# Patient Record
Sex: Male | Born: 1960
Health system: Southern US, Community
[De-identification: ages and names within clinical notes are randomized; demographics above are authoritative.]

## PROBLEM LIST (undated history)

## (undated) DIAGNOSIS — T4145XA Adverse effect of unspecified anesthetic, initial encounter: Secondary | ICD-10-CM

## (undated) DIAGNOSIS — H269 Unspecified cataract: Secondary | ICD-10-CM

## (undated) DIAGNOSIS — R002 Palpitations: Secondary | ICD-10-CM

## (undated) DIAGNOSIS — T8859XA Other complications of anesthesia, initial encounter: Secondary | ICD-10-CM

## (undated) DIAGNOSIS — K219 Gastro-esophageal reflux disease without esophagitis: Secondary | ICD-10-CM

## (undated) DIAGNOSIS — G4730 Sleep apnea, unspecified: Secondary | ICD-10-CM

## (undated) DIAGNOSIS — T7840XA Allergy, unspecified, initial encounter: Secondary | ICD-10-CM

## (undated) DIAGNOSIS — K4090 Unilateral inguinal hernia, without obstruction or gangrene, not specified as recurrent: Secondary | ICD-10-CM

## (undated) HISTORY — DX: Sleep apnea, unspecified: G47.30

## (undated) HISTORY — PX: COLONOSCOPY: SHX174

## (undated) HISTORY — DX: Gastro-esophageal reflux disease without esophagitis: K21.9

## (undated) HISTORY — DX: Palpitations: R00.2

## (undated) HISTORY — PX: CARDIAC ELECTROPHYSIOLOGY MAPPING AND ABLATION: SHX1292

## (undated) HISTORY — PX: POLYPECTOMY: SHX149

## (undated) HISTORY — DX: Unspecified cataract: H26.9

## (undated) HISTORY — DX: Allergy, unspecified, initial encounter: T78.40XA

---

## 2004-03-27 ENCOUNTER — Ambulatory Visit (HOSPITAL_COMMUNITY): Admission: RE | Admit: 2004-03-27 | Discharge: 2004-03-28 | Payer: Self-pay | Admitting: Ophthalmology

## 2004-10-13 HISTORY — PX: RETINAL DETACHMENT REPAIR W/ SCLERAL BUCKLE LE: SHX2338

## 2006-08-21 ENCOUNTER — Encounter: Admission: RE | Admit: 2006-08-21 | Discharge: 2006-08-21 | Payer: Self-pay | Admitting: Cardiology

## 2009-10-13 HISTORY — PX: VASECTOMY: SHX75

## 2012-03-18 ENCOUNTER — Encounter (INDEPENDENT_AMBULATORY_CARE_PROVIDER_SITE_OTHER): Payer: 59 | Admitting: Ophthalmology

## 2012-03-18 DIAGNOSIS — H33009 Unspecified retinal detachment with retinal break, unspecified eye: Secondary | ICD-10-CM

## 2012-03-18 DIAGNOSIS — H43819 Vitreous degeneration, unspecified eye: Secondary | ICD-10-CM

## 2012-03-18 DIAGNOSIS — H35379 Puckering of macula, unspecified eye: Secondary | ICD-10-CM

## 2012-03-18 DIAGNOSIS — H2510 Age-related nuclear cataract, unspecified eye: Secondary | ICD-10-CM

## 2012-03-18 DIAGNOSIS — H33309 Unspecified retinal break, unspecified eye: Secondary | ICD-10-CM

## 2012-03-18 DIAGNOSIS — H3530 Unspecified macular degeneration: Secondary | ICD-10-CM

## 2012-08-09 ENCOUNTER — Other Ambulatory Visit: Payer: Self-pay | Admitting: Family Medicine

## 2012-08-09 DIAGNOSIS — M542 Cervicalgia: Secondary | ICD-10-CM

## 2012-08-13 ENCOUNTER — Ambulatory Visit
Admission: RE | Admit: 2012-08-13 | Discharge: 2012-08-13 | Disposition: A | Discharge status: Home / Self Care | Payer: BC Managed Care – PPO | Source: Ambulatory Visit | Attending: Family Medicine | Admitting: Family Medicine

## 2012-08-13 DIAGNOSIS — M542 Cervicalgia: Secondary | ICD-10-CM

## 2013-02-23 ENCOUNTER — Encounter: Payer: Self-pay | Admitting: Internal Medicine

## 2013-02-23 ENCOUNTER — Ambulatory Visit (INDEPENDENT_AMBULATORY_CARE_PROVIDER_SITE_OTHER): Payer: BC Managed Care – PPO | Admitting: Internal Medicine

## 2013-02-23 VITALS — BP 107/70 | HR 61 | Ht 72.0 in | Wt 165.0 lb

## 2013-02-23 DIAGNOSIS — R002 Palpitations: Secondary | ICD-10-CM

## 2013-02-23 HISTORY — DX: Palpitations: R00.2

## 2013-02-23 MED ORDER — VERAPAMIL HCL 40 MG PO TABS: ORAL_TABLET | ORAL | Status: DC

## 2013-02-23 NOTE — Patient Instructions (Addendum)
Your physician recommends that you schedule a follow-up appointment as needed   Your physician has recommended you make the following change in your medication:  1) Will call in Verapamil 40mg  as needed #10 no refills

## 2013-02-23 NOTE — Assessment & Plan Note (Signed)
The patient has tachypalpitations. These are new in onset over the last 3 months. There are abrupt in onset and offset and associated with lightheadedness. They're frog positive which strongly suggests AV node reentry. At this point he would like to do nothing. We reviewed Valsalva and carotid massage; I've also given a prescription for verapamil 40 to have available on an as-needed basis. To followup as needed

## 2013-02-23 NOTE — Progress Notes (Signed)
   ELECTROPHYSIOLOGY CONSULT NOTE  Patient ID: Blake Sims, MRN: [id number], DOB/AGE: May 09, 1961 52 y.o. Admit date: (Not on file) Date of Consult: 02/23/2013  Primary Physician: No primary provider on file. Primary Cardiologist: no  Chief Complaint:    HPI Blake Sims is a 52 y.o. male    Is seen at his own request because of the recent onset of tachypalpitations. These have lasted 30-60 seconds. It are abrupt in onset and offset. They are frog positive. They're associated with lightheadedness and shortness of breath.  Otherwise he is exceptionally fit without limitations.     No past medical history on file.    Surgical History: No past surgical history on file.   Home Meds: Prior to Admission medications   Not on File     Allergies:  Allergies  Allergen Reactions  . Penicillins     Swelling and itching    History   Social History  . Marital Status: Single    Spouse Name: N/A    Number of Children: N/A  . Years of Education: N/A   Occupational History  . Not on file.   Social History Main Topics  . Smoking status: Never Smoker   . Smokeless tobacco: Not on file  . Alcohol Use: Not on file  . Drug Use: Not on file  . Sexually Active: Not on file   Other Topics Concern  . Not on file   Social History Narrative  . No narrative on file     No family history on file.   ROS:  Please see the history of present illness.     All other systems reviewed and negative.    Physical Exam: Blood pressure 107/70, pulse 61, height 6' (1.829 m), weight 165 lb (74.844 kg). General: Well developed, well nourished male in no acute distress. Head: Normocephalic, atraumatic, sclera non-icteric, no xanthomas, nares are without discharge. EENT: normal Lymph Nodes:  none Back: without scoliosis/kyphosis, no CVA tendersness Neck: Negative for carotid bruits. JVD not elevated. Lungs: Clear bilaterally to auscultation without wheezes, rales, or rhonchi.  Breathing is unlabored. Heart: RRR with S1 S2. No   systolic murmur , rubs, or gallops appreciated. Abdomen: Soft, non-tender, non-distended with normoactive bowel sounds. No hepatomegaly. No rebound/guarding. No obvious abdominal masses. Msk:  Strength and tone appear normal for age. Extremities: No clubbing or cyanosis. No ed ema.  Distal pedal pulses are 2+ and equal bilaterally. Skin: Warm and Dry Neuro: Alert and oriented X 3. CN III-XII intact Grossly normal sensory and motor function . Psych:  Responds to questions appropriately with a normal affect.      Labs: Cardiac Enzymes No results found for this basename: CKTOTAL, CKMB, TROPONINI,  in the last 72 hours CBC  Radiology/Studies:  No results found.  EKG: NSR 14/09/42 nst    Assessment and Plan:    Blake Sims

## 2013-03-18 ENCOUNTER — Ambulatory Visit (INDEPENDENT_AMBULATORY_CARE_PROVIDER_SITE_OTHER): Payer: BC Managed Care – PPO | Admitting: Ophthalmology

## 2013-03-18 DIAGNOSIS — H35349 Macular cyst, hole, or pseudohole, unspecified eye: Secondary | ICD-10-CM

## 2013-03-18 DIAGNOSIS — H43819 Vitreous degeneration, unspecified eye: Secondary | ICD-10-CM

## 2013-03-18 DIAGNOSIS — H2510 Age-related nuclear cataract, unspecified eye: Secondary | ICD-10-CM

## 2013-03-18 DIAGNOSIS — H33009 Unspecified retinal detachment with retinal break, unspecified eye: Secondary | ICD-10-CM

## 2013-03-18 DIAGNOSIS — H4420 Degenerative myopia, unspecified eye: Secondary | ICD-10-CM

## 2013-03-18 DIAGNOSIS — H33309 Unspecified retinal break, unspecified eye: Secondary | ICD-10-CM

## 2014-03-22 ENCOUNTER — Ambulatory Visit (INDEPENDENT_AMBULATORY_CARE_PROVIDER_SITE_OTHER): Payer: BC Managed Care – PPO | Admitting: Ophthalmology

## 2014-03-22 DIAGNOSIS — H2510 Age-related nuclear cataract, unspecified eye: Secondary | ICD-10-CM

## 2014-03-22 DIAGNOSIS — H33309 Unspecified retinal break, unspecified eye: Secondary | ICD-10-CM

## 2014-03-22 DIAGNOSIS — H33009 Unspecified retinal detachment with retinal break, unspecified eye: Secondary | ICD-10-CM

## 2014-03-22 DIAGNOSIS — H43819 Vitreous degeneration, unspecified eye: Secondary | ICD-10-CM

## 2014-03-22 DIAGNOSIS — H4420 Degenerative myopia, unspecified eye: Secondary | ICD-10-CM

## 2014-04-12 ENCOUNTER — Encounter (INDEPENDENT_AMBULATORY_CARE_PROVIDER_SITE_OTHER): Payer: Self-pay | Admitting: Surgery

## 2014-04-25 ENCOUNTER — Encounter (INDEPENDENT_AMBULATORY_CARE_PROVIDER_SITE_OTHER): Payer: Self-pay | Admitting: Surgery

## 2014-04-25 ENCOUNTER — Ambulatory Visit (INDEPENDENT_AMBULATORY_CARE_PROVIDER_SITE_OTHER): Payer: BC Managed Care – PPO | Admitting: Surgery

## 2014-04-25 VITALS — BP 118/60 | HR 64 | Temp 98.6°F | Resp 14 | Ht 72.0 in | Wt 169.6 lb

## 2014-04-25 DIAGNOSIS — K4020 Bilateral inguinal hernia, without obstruction or gangrene, not specified as recurrent: Secondary | ICD-10-CM | POA: Insufficient documentation

## 2014-04-25 HISTORY — DX: Bilateral inguinal hernia, without obstruction or gangrene, not specified as recurrent: K40.20

## 2014-04-25 NOTE — Progress Notes (Signed)
Patient ID: Blake Sims, male   DOB: 06-Jul-1961, 53 y.o.   MRN: [id number]  Chief Complaint  Patient presents with  . Inguinal Hernia    new pt- eval [facility]    HPI Blake Sims is a 53 y.o. male.  Referred by Dr. Orpah Melter for evaluation of left inguinal hernia  HPI This is a healthy 53 year old male who presents with several years of intermittent discomfort in his left groin. He was previously evaluated by a surgeon and 2009 and was not felt to have a hernia at that time. The patient is quite healthy and exercises vigorously. He has developed discomfort in both groins. The left side seems to be worse than the right. Occasionally feels pulling sensations on each side. He denies any visible or palpable bulge.  He denies any obstructive symptoms. Past Medical History  Diagnosis Date  . Rapid palpitations 02/23/2013    Past Surgical History  Procedure Laterality Date  . Retinal detachment repair w/ scleral buckle le      History reviewed. No pertinent family history.  Social History History  Substance Use Topics  . Smoking status: Never Smoker   . Smokeless tobacco: Not on file  . Alcohol Use: No    Allergies  Allergen Reactions  . Penicillins     Swelling and itching    No current outpatient prescriptions on file.   No current facility-administered medications for this visit.    Review of Systems Review of Systems  Constitutional: Negative for fever, chills and unexpected weight change.  HENT: Negative for congestion, hearing loss, sore throat, trouble swallowing and voice change.   Eyes: Negative for visual disturbance.  Respiratory: Negative for cough and wheezing.   Cardiovascular: Negative for chest pain, palpitations and leg swelling.  Gastrointestinal: Negative for nausea, vomiting, abdominal pain, diarrhea, constipation, blood in stool, abdominal distention, anal bleeding and rectal pain.  Genitourinary: Negative for hematuria and difficulty  urinating.  Musculoskeletal: Negative for arthralgias.  Skin: Negative for rash and wound.  Neurological: Negative for seizures, syncope, weakness and headaches.  Hematological: Negative for adenopathy. Does not bruise/bleed easily.  Psychiatric/Behavioral: Negative for confusion.    Blood pressure 118/60, pulse 64, temperature 98.6 F (37 C), temperature source Oral, resp. rate 14, height 6' (1.829 m), weight 169 lb 9.6 oz (76.93 kg).  Physical Exam Physical Exam WDWN in NAD HEENT:  EOMI, sclera anicteric Neck:  No masses, no thyromegaly Lungs:  CTA bilaterally; normal respiratory effort CV:  Regular rate and rhythm; no murmurs Abd:  +bowel sounds, soft, non-tender, no masses GU: Bilateral descended testes, no testicular masses. Small spontaneously reducible left inguinal hernia when standing with Valsalva maneuver. The patient has some laxity and a very small bulge in the right inguinal canal on Valsalva maneuver. Ext:  Well-perfused; no edema Skin:  Warm, dry; no sign of jaundice  Data Reviewed none  Assessment    Bilateral inguinal hernias - both spontaneously reducible     Plan    Laparoscopic bilateral inguinal hernia repairs with mesh.  The surgical procedure has been discussed with the patient.  Potential risks, benefits, alternative treatments, and expected outcomes have been explained.  All of the patient's questions at this time have been answered.  The likelihood of reaching the patient's treatment goal is good.  The patient understand the proposed surgical procedure and wishes to proceed.         Orean Giarratano K. 04/25/2014, 6:18 PM

## 2014-05-31 ENCOUNTER — Encounter (HOSPITAL_COMMUNITY): Payer: Self-pay

## 2014-05-31 ENCOUNTER — Other Ambulatory Visit (INDEPENDENT_AMBULATORY_CARE_PROVIDER_SITE_OTHER): Payer: Self-pay | Admitting: Surgery

## 2014-05-31 ENCOUNTER — Encounter (HOSPITAL_COMMUNITY)
Admission: RE | Admit: 2014-05-31 | Discharge: 2014-05-31 | Disposition: A | Discharge status: Home / Self Care | Payer: BC Managed Care – PPO | Source: Ambulatory Visit | Attending: Surgery | Admitting: Surgery

## 2014-05-31 DIAGNOSIS — Z01818 Encounter for other preprocedural examination: Secondary | ICD-10-CM | POA: Insufficient documentation

## 2014-05-31 DIAGNOSIS — K4090 Unilateral inguinal hernia, without obstruction or gangrene, not specified as recurrent: Secondary | ICD-10-CM | POA: Insufficient documentation

## 2014-05-31 HISTORY — DX: Other complications of anesthesia, initial encounter: T88.59XA

## 2014-05-31 HISTORY — DX: Adverse effect of unspecified anesthetic, initial encounter: T41.45XA

## 2014-05-31 HISTORY — DX: Unilateral inguinal hernia, without obstruction or gangrene, not specified as recurrent: K40.90

## 2014-05-31 LAB — CBC
HEMATOCRIT: 44.2 % (ref 39.0–52.0)
HEMOGLOBIN: 15.4 g/dL (ref 13.0–17.0)
MCH: 32.2 pg (ref 26.0–34.0)
MCHC: 34.8 g/dL (ref 30.0–36.0)
MCV: 92.3 fL (ref 78.0–100.0)
Platelets: 176 10*3/uL (ref 150–400)
RBC: 4.79 MIL/uL (ref 4.22–5.81)
RDW: 12.2 % (ref 11.5–15.5)
WBC: 5.5 10*3/uL (ref 4.0–10.5)

## 2014-05-31 NOTE — Pre-Procedure Instructions (Addendum)
Theodor Mustin Tangeman  05/31/2014   Your procedure is scheduled on:  Thursday, June 08, 2014  Report to [facility] Admitting at 5:30 AM.  Call this number if you have problems the morning of surgery: [phone]   Remember:   Do not eat food or drink liquids after midnight Wednesday, June 07, 2014   Take these medicines the morning of surgery with A SIP OF WATER: none  Stop taking any Aspirin, multivitamins, and herbal medications. Do not take any NSAIDs ie: Ibuprofen, Advil, Naproxen or any medication containing Aspirin; stop Thursday,  06/03/14.    Do not wear jewelry, make-up or nail polish.  Do not wear lotions, powders, or perfumes. You may not wear deodorant.  Do not shave 48 hours prior to surgery. Men may shave face and neck.  Do not bring valuables to the hospital.  [facility] is not responsible for any belongings or valuables.               Contacts, dentures or bridgework may not be worn into surgery.  Leave suitcase in the car. After surgery it may be brought to your room.  For patients admitted to the hospital, discharge time is determined by your treatment team.               Patients discharged the day of surgery will not be allowed to drive home.  Name and phone number of your driver:  Special Instructions:  Special Instructions:Special Instructions: [facility] - Preparing for Surgery  Before surgery, you can play an important role.  Because skin is not sterile, your skin needs to be as free of germs as possible.  You can reduce the number of germs on you skin by washing with CHG (chlorahexidine gluconate) soap before surgery.  CHG is an antiseptic cleaner which kills germs and bonds with the skin to continue killing germs even after washing.  Please DO NOT use if you have an allergy to CHG or antibacterial soaps.  If your skin becomes reddened/irritated stop using the CHG and inform your nurse when you arrive at Short Stay.  Do not shave (including  legs and underarms) for at least 48 hours prior to the first CHG shower.  You may shave your face.  Please follow these instructions carefully:   1.  Shower with CHG Soap the night before surgery and the morning of Surgery.  2.  If you choose to wash your hair, wash your hair first as usual with your normal shampoo.  3.  After you shampoo, rinse your hair and body thoroughly to remove the Shampoo.  4.  Use CHG as you would any other liquid soap.  You can apply chg directly  to the skin and wash gently with scrungie or a clean washcloth.  5.  Apply the CHG Soap to your body ONLY FROM THE NECK DOWN.  Do not use on open wounds or open sores.  Avoid contact with your eyes, ears, mouth and genitals (private parts).  Wash genitals (private parts) with your normal soap.  6.  Wash thoroughly, paying special attention to the area where your surgery will be performed.  7.  Thoroughly rinse your body with warm water from the neck down.  8.  DO NOT shower/wash with your normal soap after using and rinsing off the CHG Soap.  9.  Pat yourself dry with a clean towel.            10.  Wear clean pajamas.  11.  Place clean sheets on your bed the night of your first shower and do not sleep with pets.  Day of Surgery  Do not apply any lotions/deodorants the morning of surgery.  Please wear clean clothes to the hospital/surgery center.   Please read over the following fact sheets that you were given: Pain Booklet, Coughing and Deep Breathing and Surgical Site Infection Prevention

## 2014-06-07 MED ORDER — VANCOMYCIN HCL IN DEXTROSE 1-5 GM/200ML-% IV SOLN
1000.0000 mg | INTRAVENOUS | Status: AC
  Administered 2014-06-08: 1000 mg via INTRAVENOUS
  Filled 2014-06-07: qty 200

## 2014-06-08 ENCOUNTER — Encounter (HOSPITAL_COMMUNITY)
Admission: RE | Disposition: A | Discharge status: Home / Self Care | Payer: Self-pay | Source: Ambulatory Visit | Attending: Surgery

## 2014-06-08 ENCOUNTER — Ambulatory Visit (HOSPITAL_COMMUNITY)
Admission: RE | Admit: 2014-06-08 | Discharge: 2014-06-08 | Disposition: A | Discharge status: Home / Self Care | Payer: BC Managed Care – PPO | Source: Ambulatory Visit | Attending: Surgery | Admitting: Surgery

## 2014-06-08 ENCOUNTER — Ambulatory Visit (HOSPITAL_COMMUNITY): Payer: BC Managed Care – PPO | Admitting: Anesthesiology

## 2014-06-08 ENCOUNTER — Encounter (HOSPITAL_COMMUNITY): Payer: BC Managed Care – PPO | Admitting: Anesthesiology

## 2014-06-08 ENCOUNTER — Encounter (HOSPITAL_COMMUNITY): Payer: Self-pay | Admitting: *Deleted

## 2014-06-08 DIAGNOSIS — K4020 Bilateral inguinal hernia, without obstruction or gangrene, not specified as recurrent: Secondary | ICD-10-CM | POA: Insufficient documentation

## 2014-06-08 DIAGNOSIS — I498 Other specified cardiac arrhythmias: Secondary | ICD-10-CM | POA: Diagnosis not present

## 2014-06-08 HISTORY — PX: INSERTION OF MESH: SHX5868

## 2014-06-08 HISTORY — PX: INGUINAL HERNIA REPAIR: SHX194

## 2014-06-08 SURGERY — REPAIR, HERNIA, INGUINAL, LAPAROSCOPIC
Anesthesia: General | Site: Groin | Laterality: Bilateral

## 2014-06-08 MED ORDER — MORPHINE SULFATE 2 MG/ML IJ SOLN: 2.0000 mg | INTRAMUSCULAR | Status: DC | PRN

## 2014-06-08 MED ORDER — OXYCODONE-ACETAMINOPHEN 5-325 MG PO TABS: 1.0000 | ORAL_TABLET | ORAL | Status: DC | PRN

## 2014-06-08 MED ORDER — BUPIVACAINE-EPINEPHRINE (PF) 0.5% -1:200000 IJ SOLN
INTRAMUSCULAR | Status: DC | PRN
  Administered 2014-06-08: 30 mL

## 2014-06-08 MED ORDER — BUPIVACAINE-EPINEPHRINE (PF) 0.25% -1:200000 IJ SOLN
INTRAMUSCULAR | Status: AC
  Filled 2014-06-08: qty 30

## 2014-06-08 MED ORDER — LIDOCAINE-EPINEPHRINE (PF) 1.5 %-1:200000 IJ SOLN
INTRAMUSCULAR | Status: DC | PRN
  Administered 2014-06-08: 30 mL

## 2014-06-08 MED ORDER — LACTATED RINGERS IV SOLN
INTRAVENOUS | Status: DC | PRN
  Administered 2014-06-08 (×2): via INTRAVENOUS

## 2014-06-08 MED ORDER — ONDANSETRON HCL 4 MG/2ML IJ SOLN: 4.0000 mg | INTRAMUSCULAR | Status: DC | PRN

## 2014-06-08 MED ORDER — BUPIVACAINE-EPINEPHRINE 0.25% -1:200000 IJ SOLN
INTRAMUSCULAR | Status: DC | PRN
  Administered 2014-06-08: 9 mL

## 2014-06-08 MED ORDER — LIDOCAINE HCL (CARDIAC) 20 MG/ML IV SOLN
INTRAVENOUS | Status: DC | PRN
  Administered 2014-06-08: 100 mg via INTRAVENOUS

## 2014-06-08 MED ORDER — MEPERIDINE HCL 25 MG/ML IJ SOLN: 6.2500 mg | INTRAMUSCULAR | Status: DC | PRN

## 2014-06-08 MED ORDER — ONDANSETRON HCL 4 MG/2ML IJ SOLN
INTRAMUSCULAR | Status: AC
  Filled 2014-06-08: qty 2

## 2014-06-08 MED ORDER — OXYCODONE HCL 5 MG/5ML PO SOLN: 5.0000 mg | Freq: Once | ORAL | Status: DC | PRN

## 2014-06-08 MED ORDER — HYDROMORPHONE HCL PF 1 MG/ML IJ SOLN
INTRAMUSCULAR | Status: AC
Start: 2014-06-08 — End: 2014-06-08
  Administered 2014-06-08: 0.5 mg via INTRAVENOUS
  Filled 2014-06-08: qty 1

## 2014-06-08 MED ORDER — MIDAZOLAM HCL 2 MG/2ML IJ SOLN
INTRAMUSCULAR | Status: AC
  Filled 2014-06-08: qty 2

## 2014-06-08 MED ORDER — DEXAMETHASONE SODIUM PHOSPHATE 10 MG/ML IJ SOLN
INTRAMUSCULAR | Status: DC | PRN
  Administered 2014-06-08: 10 mg via INTRAVENOUS

## 2014-06-08 MED ORDER — GLYCOPYRROLATE 0.2 MG/ML IJ SOLN
INTRAMUSCULAR | Status: AC
  Filled 2014-06-08: qty 2

## 2014-06-08 MED ORDER — 0.9 % SODIUM CHLORIDE (POUR BTL) OPTIME
TOPICAL | Status: DC | PRN
  Administered 2014-06-08: 1000 mL

## 2014-06-08 MED ORDER — PROPOFOL 10 MG/ML IV BOLUS
INTRAVENOUS | Status: DC | PRN
  Administered 2014-06-08: 200 mg via INTRAVENOUS

## 2014-06-08 MED ORDER — GLYCOPYRROLATE 0.2 MG/ML IJ SOLN
INTRAMUSCULAR | Status: DC | PRN
  Administered 2014-06-08: 0.2 mg via INTRAVENOUS
  Administered 2014-06-08: .7 mg via INTRAVENOUS

## 2014-06-08 MED ORDER — ROCURONIUM BROMIDE 100 MG/10ML IV SOLN
INTRAVENOUS | Status: DC | PRN
  Administered 2014-06-08: 50 mg via INTRAVENOUS

## 2014-06-08 MED ORDER — TAMSULOSIN HCL 0.4 MG PO CAPS: 0.4000 mg | ORAL_CAPSULE | Freq: Every day | ORAL | Status: DC

## 2014-06-08 MED ORDER — ARTIFICIAL TEARS OP OINT
TOPICAL_OINTMENT | OPHTHALMIC | Status: AC
  Filled 2014-06-08: qty 3.5

## 2014-06-08 MED ORDER — OXYCODONE HCL 5 MG PO TABS: 5.0000 mg | ORAL_TABLET | Freq: Once | ORAL | Status: DC | PRN

## 2014-06-08 MED ORDER — PROPOFOL 10 MG/ML IV BOLUS
INTRAVENOUS | Status: AC
  Filled 2014-06-08: qty 20

## 2014-06-08 MED ORDER — NEOSTIGMINE METHYLSULFATE 10 MG/10ML IV SOLN
INTRAVENOUS | Status: AC
  Filled 2014-06-08: qty 1

## 2014-06-08 MED ORDER — FENTANYL CITRATE 0.05 MG/ML IJ SOLN
INTRAMUSCULAR | Status: AC
  Filled 2014-06-08: qty 5

## 2014-06-08 MED ORDER — PHENYLEPHRINE 40 MCG/ML (10ML) SYRINGE FOR IV PUSH (FOR BLOOD PRESSURE SUPPORT)
PREFILLED_SYRINGE | INTRAVENOUS | Status: AC
  Filled 2014-06-08: qty 10

## 2014-06-08 MED ORDER — PHENYLEPHRINE HCL 10 MG/ML IJ SOLN
INTRAMUSCULAR | Status: DC | PRN
  Administered 2014-06-08 (×5): 80 ug via INTRAVENOUS
  Administered 2014-06-08: 40 ug via INTRAVENOUS

## 2014-06-08 MED ORDER — CHLORHEXIDINE GLUCONATE 4 % EX LIQD
1.0000 "application " | Freq: Once | CUTANEOUS | Status: DC
  Filled 2014-06-08: qty 15

## 2014-06-08 MED ORDER — NEOSTIGMINE METHYLSULFATE 10 MG/10ML IV SOLN
INTRAVENOUS | Status: DC | PRN
  Administered 2014-06-08: 4 mg via INTRAVENOUS

## 2014-06-08 MED ORDER — MIDAZOLAM HCL 5 MG/5ML IJ SOLN
INTRAMUSCULAR | Status: DC | PRN
  Administered 2014-06-08: 2 mg via INTRAVENOUS

## 2014-06-08 MED ORDER — TAMSULOSIN HCL 0.4 MG PO CAPS
0.4000 mg | ORAL_CAPSULE | Freq: Once | ORAL | Status: AC
  Administered 2014-06-08: 0.4 mg via ORAL
  Filled 2014-06-08: qty 1

## 2014-06-08 MED ORDER — SUCCINYLCHOLINE CHLORIDE 20 MG/ML IJ SOLN
INTRAMUSCULAR | Status: AC
  Filled 2014-06-08: qty 1

## 2014-06-08 MED ORDER — ONDANSETRON HCL 4 MG/2ML IJ SOLN
INTRAMUSCULAR | Status: DC | PRN
  Administered 2014-06-08: 4 mg via INTRAVENOUS

## 2014-06-08 MED ORDER — HYDROMORPHONE HCL PF 1 MG/ML IJ SOLN
0.2500 mg | INTRAMUSCULAR | Status: DC | PRN
  Administered 2014-06-08 (×2): 0.5 mg via INTRAVENOUS

## 2014-06-08 MED ORDER — ONDANSETRON HCL 4 MG/2ML IJ SOLN: 4.0000 mg | Freq: Once | INTRAMUSCULAR | Status: DC | PRN

## 2014-06-08 MED ORDER — EPHEDRINE SULFATE 50 MG/ML IJ SOLN
INTRAMUSCULAR | Status: AC
  Filled 2014-06-08: qty 1

## 2014-06-08 MED ORDER — FENTANYL CITRATE 0.05 MG/ML IJ SOLN
INTRAMUSCULAR | Status: DC | PRN
  Administered 2014-06-08: 50 ug via INTRAVENOUS
  Administered 2014-06-08: 100 ug via INTRAVENOUS

## 2014-06-08 MED ORDER — OXYCODONE-ACETAMINOPHEN 5-325 MG PO TABS
ORAL_TABLET | ORAL | Status: AC
  Administered 2014-06-08: 1 via ORAL
  Filled 2014-06-08: qty 1

## 2014-06-08 MED ORDER — ROCURONIUM BROMIDE 50 MG/5ML IV SOLN
INTRAVENOUS | Status: AC
  Filled 2014-06-08: qty 1

## 2014-06-08 MED ORDER — OXYCODONE-ACETAMINOPHEN 5-325 MG PO TABS
1.0000 | ORAL_TABLET | ORAL | Status: DC | PRN
  Administered 2014-06-08: 1 via ORAL

## 2014-06-08 SURGICAL SUPPLY — 45 items
APL SKNCLS STERI-STRIP NONHPOA (GAUZE/BANDAGES/DRESSINGS) ×1
APPLIER CLIP LOGIC TI 5 (MISCELLANEOUS) IMPLANT
APR CLP MED LRG 33X5 (MISCELLANEOUS)
BENZOIN TINCTURE PRP APPL 2/3 (GAUZE/BANDAGES/DRESSINGS) ×2 IMPLANT
CANISTER SUCTION 2500CC (MISCELLANEOUS) IMPLANT
COVER SURGICAL LIGHT HANDLE (MISCELLANEOUS) ×2 IMPLANT
DECANTER SPIKE VIAL GLASS SM (MISCELLANEOUS) ×1 IMPLANT
DEVICE SECURE STRAP 25 ABSORB (INSTRUMENTS) ×2 IMPLANT
DISSECT BALLN SPACEMKR + OVL (BALLOONS) ×2
DISSECTOR BALLN SPACEMKR + OVL (BALLOONS) ×1 IMPLANT
DISSECTOR BLUNT TIP ENDO 5MM (MISCELLANEOUS) IMPLANT
DRAPE UTILITY 15X26 W/TAPE STR (DRAPE) ×4 IMPLANT
DRSG TEGADERM 2-3/8X2-3/4 SM (GAUZE/BANDAGES/DRESSINGS) ×2 IMPLANT
DRSG TEGADERM 4X4.75 (GAUZE/BANDAGES/DRESSINGS) ×1 IMPLANT
ELECT REM PT RETURN 9FT ADLT (ELECTROSURGICAL) ×2
ELECTRODE REM PT RTRN 9FT ADLT (ELECTROSURGICAL) ×1 IMPLANT
GAUZE SPONGE 2X2 8PLY STRL LF (GAUZE/BANDAGES/DRESSINGS) IMPLANT
GLOVE BIO SURGEON STRL SZ 6.5 (GLOVE) ×1 IMPLANT
GLOVE BIO SURGEON STRL SZ7 (GLOVE) ×3 IMPLANT
GLOVE BIOGEL PI IND STRL 6 (GLOVE) IMPLANT
GLOVE BIOGEL PI IND STRL 6.5 (GLOVE) IMPLANT
GLOVE BIOGEL PI IND STRL 7.0 (GLOVE) IMPLANT
GLOVE BIOGEL PI IND STRL 7.5 (GLOVE) ×1 IMPLANT
GLOVE BIOGEL PI INDICATOR 6 (GLOVE) ×1
GLOVE BIOGEL PI INDICATOR 6.5 (GLOVE) ×2
GLOVE BIOGEL PI INDICATOR 7.0 (GLOVE) ×2
GLOVE BIOGEL PI INDICATOR 7.5 (GLOVE) ×1
GOWN STRL REUS W/ TWL LRG LVL3 (GOWN DISPOSABLE) ×2 IMPLANT
GOWN STRL REUS W/TWL LRG LVL3 (GOWN DISPOSABLE) ×8
KIT BASIN OR (CUSTOM PROCEDURE TRAY) ×2 IMPLANT
KIT ROOM TURNOVER OR (KITS) ×2 IMPLANT
MESH 3DMAX LIGHT 4.1X6.2 LT LR (Mesh General) ×1 IMPLANT
MESH 3DMAX LIGHT 4.1X6.2 RT LR (Mesh General) ×1 IMPLANT
NS IRRIG 1000ML POUR BTL (IV SOLUTION) ×2 IMPLANT
PAD ARMBOARD 7.5X6 YLW CONV (MISCELLANEOUS) ×4 IMPLANT
SET IRRIG TUBING LAPAROSCOPIC (IRRIGATION / IRRIGATOR) IMPLANT
SET TROCAR LAP APPLE-HUNT 5MM (ENDOMECHANICALS) ×2 IMPLANT
SPONGE GAUZE 2X2 STER 10/PKG (GAUZE/BANDAGES/DRESSINGS) ×1
STRIP CLOSURE SKIN 1/2X4 (GAUZE/BANDAGES/DRESSINGS) ×1 IMPLANT
SUT MNCRL AB 4-0 PS2 18 (SUTURE) ×3 IMPLANT
TOWEL OR 17X24 6PK STRL BLUE (TOWEL DISPOSABLE) ×1 IMPLANT
TOWEL OR 17X26 10 PK STRL BLUE (TOWEL DISPOSABLE) ×2 IMPLANT
TRAY FOLEY CATH 16FR SILVER (SET/KITS/TRAYS/PACK) ×2 IMPLANT
TRAY LAPAROSCOPIC (CUSTOM PROCEDURE TRAY) ×2 IMPLANT
WATER STERILE IRR 1000ML POUR (IV SOLUTION) IMPLANT

## 2014-06-08 NOTE — Transfer of Care (Signed)
Immediate Anesthesia Transfer of Care Note  Patient: Blake Sims  Procedure(s) Performed: Procedure(s): LAPAROSCOPIC BILATERAL INGUINAL HERNIA REPAIR  (Bilateral) INSERTION OF MESH (Bilateral)  Patient Location: PACU  Anesthesia Type:General  Level of Consciousness: awake, alert  and oriented  Airway & Oxygen Therapy: Patient Spontanous Breathing and Patient connected to face mask oxygen  Post-op Assessment: Report given to PACU RN  Post vital signs: Reviewed and stable  Complications: No apparent anesthesia complications

## 2014-06-08 NOTE — Anesthesia Preprocedure Evaluation (Addendum)
Anesthesia Evaluation  Patient identified by MRN, date of birth, ID band Patient awake    Reviewed: Allergy & Precautions, H&P , NPO status , Patient's Chart, lab work & pertinent test results  Airway Mallampati: II TM Distance: >3 FB Neck ROM: Full  Mouth opening: Limited Mouth Opening  Dental  (+) Teeth Intact   Pulmonary    Pulmonary exam normal       Cardiovascular + dysrhythmias Rate:Bradycardia     Neuro/Psych    GI/Hepatic   Endo/Other    Renal/GU      Musculoskeletal   Abdominal Normal abdominal exam  (+)   Peds  Hematology   Anesthesia Other Findings   Reproductive/Obstetrics                          Anesthesia Physical Anesthesia Plan  ASA: II  Anesthesia Plan: General   Post-op Pain Management:    Induction: Intravenous  Airway Management Planned: Oral ETT  Additional Equipment:   Intra-op Plan:   Post-operative Plan: Extubation in OR  Informed Consent: I have reviewed the patients History and Physical, chart, labs and discussed the procedure including the risks, benefits and alternatives for the proposed anesthesia with the patient or authorized representative who has indicated his/her understanding and acceptance.   Dental advisory given  Plan Discussed with: CRNA and Surgeon  Anesthesia Plan Comments:        Anesthesia Quick Evaluation

## 2014-06-08 NOTE — Anesthesia Procedure Notes (Addendum)
Anesthesia Regional Block:  TAP block  Pre-Anesthetic Checklist: ,, timeout performed, Correct Patient, Correct Site, Correct Laterality, Correct Procedure, Correct Position, site marked, Risks and benefits discussed,  Surgical consent,  Pre-op evaluation,  At surgeon's request and post-op pain management  Laterality: Right and Left  Prep: chloraprep and alcohol swabs       Needles:  Injection technique: Single-shot  Needle Type: Echogenic Stimulator Needle     Needle Length: 9cm 9 cm Needle Gauge: 21 and 21 G    Additional Needles:  Procedures: ultrasound guided (picture in chart) TAP block Narrative:  Start time: 06/08/2014 7:05 AM End time: 06/08/2014 7:20 AM Injection made incrementally with aspirations every 5 mL.  Performed by: Personally  Anesthesiologist: Lillia Abed MD  Additional Notes: Monitors applied. Patient sedated. Sterile prep and drape,hand hygiene and sterile gloves were used. Relevant anatomy identified.Needle position confirmed.Local anesthetic injected incrementally after negative aspiration. Local anesthetic spread visualized in Transversus Abdominus Plane on both sides. Vascular puncture avoided. No complications. Image printed for medical record.The patient tolerated the procedure well.

## 2014-06-08 NOTE — Progress Notes (Signed)
#  16 foley placed by sterile technique to straight drain leg bag.  Safe foley leg strap in place with leg bag straps.  Foley emptied for 700cc  Amber clear yellow urine.  Pt instructed in foley care and how to maintain and empty his foley. Instructed that Dr Georgette Dover would contact urologist who would call him with a follow up appointment.  Pt to call office with any problems.  Pt has tolerated [place] sandwich and po fluids. Verbalizes understanding of discharge instructions.

## 2014-06-08 NOTE — Progress Notes (Signed)
Pt has been drinking and walking and trying to void.  Has eaten and taking a pain pill but says he is only trickling when he attempts to void and it burns.  Dr. Molli Posey notified. Orders received to send pt home with a leg bag and urology will call him with an appointment for follow up.

## 2014-06-08 NOTE — Anesthesia Postprocedure Evaluation (Signed)
Anesthesia Post Note  Patient: Blake Sims  Procedure(s) Performed: Procedure(s) (LRB): LAPAROSCOPIC BILATERAL INGUINAL HERNIA REPAIR  (Bilateral) INSERTION OF MESH (Bilateral)  Anesthesia type: general  Patient location: PACU  Post pain: Pain level controlled  Post assessment: Patient's Cardiovascular Status Stable  Last Vitals:  Filed Vitals:   06/08/14 1034  BP: 132/83  Pulse: 72  Temp:   Resp:     Post vital signs: Reviewed and stable  Level of consciousness: sedated  Complications: No apparent anesthesia complications

## 2014-06-08 NOTE — H&P (Signed)
Chief Complaint   Patient presents with   .  Inguinal Hernia       new pt- eval [facility]        HPI Blake Sims is a 53 y.o. male.  Referred by Dr. Orpah Melter for evaluation of left inguinal hernia  HPI This is a healthy 53 year old male who presents with several years of intermittent discomfort in his left groin. He was previously evaluated by a surgeon and 2009 and was not felt to have a hernia at that time. The patient is quite healthy and exercises vigorously. He has developed discomfort in both groins. The left side seems to be worse than the right. Occasionally feels pulling sensations on each side. He denies any visible or palpable bulge.  He denies any obstructive symptoms. Past Medical History   Diagnosis  Date   .  Rapid palpitations  02/23/2013         Past Surgical History   Procedure  Laterality  Date   .  Retinal detachment repair w/ scleral buckle le            History reviewed. No pertinent family history.   Social History History   Substance Use Topics   .  Smoking status:  Never Smoker    .  Smokeless tobacco:  Not on file   .  Alcohol Use:  No         Allergies   Allergen  Reactions   .  Penicillins         Swelling and itching         No current outpatient prescriptions on file.       No current facility-administered medications for this visit.        Review of Systems Review of Systems  Constitutional: Negative for fever, chills and unexpected weight change.  HENT: Negative for congestion, hearing loss, sore throat, trouble swallowing and voice change.   Eyes: Negative for visual disturbance.  Respiratory: Negative for cough and wheezing.   Cardiovascular: Negative for chest pain, palpitations and leg swelling.  Gastrointestinal: Negative for nausea, vomiting, abdominal pain, diarrhea, constipation, blood in stool, abdominal distention, anal bleeding and rectal pain.  Genitourinary: Negative for hematuria and  difficulty urinating.  Musculoskeletal: Negative for arthralgias.  Skin: Negative for rash and wound.  Neurological: Negative for seizures, syncope, weakness and headaches.  Hematological: Negative for adenopathy. Does not bruise/bleed easily.  Psychiatric/Behavioral: Negative for confusion.      Blood pressure 118/60, pulse 64, temperature 98.6 F (37 C), temperature source Oral, resp. rate 14, height 6' (1.829 m), weight 169 lb 9.6 oz (76.93 kg).   Physical Exam Physical Exam WDWN in NAD HEENT:  EOMI, sclera anicteric Neck:  No masses, no thyromegaly Lungs:  CTA bilaterally; normal respiratory effort CV:  Regular rate and rhythm; no murmurs Abd:  +bowel sounds, soft, non-tender, no masses GU: Bilateral descended testes, no testicular masses. Small spontaneously reducible left inguinal hernia when standing with Valsalva maneuver. The patient has some laxity and a very small bulge in the right inguinal canal on Valsalva maneuver. Ext:  Well-perfused; no edema Skin:  Warm, dry; no sign of jaundice   Data Reviewed none   Assessment    Bilateral inguinal hernias - both spontaneously reducible      Plan    Laparoscopic bilateral inguinal hernia repairs with mesh.  The surgical procedure has been discussed with the patient.  Potential risks, benefits, alternative treatments, and expected outcomes have  been explained.  All of the patient's questions at this time have been answered.  The likelihood of reaching the patient's treatment goal is good.  The patient understand the proposed surgical procedure and wishes to proceed.      Blake Sims. Blake Dover, MD, [facility] Surgery  General/ Trauma Surgery  06/08/2014 7:07 AM

## 2014-06-08 NOTE — Discharge Instructions (Signed)
What to eat:  For your first meals, you should eat lightly; only small meals initially.  If you do not have nausea, you may eat larger meals.  Avoid spicy, greasy and heavy food.    General Anesthesia, Adult, Care After  Refer to this sheet in the next few weeks. These instructions provide you with information on caring for yourself after your procedure. Your health care provider may also give you more specific instructions. Your treatment has been planned according to current medical practices, but problems sometimes occur. Call your health care provider if you have any problems or questions after your procedure.  WHAT TO EXPECT AFTER THE PROCEDURE  After the procedure, it is typical to experience:  Sleepiness.  Nausea and vomiting. HOME CARE INSTRUCTIONS  For the first 24 hours after general anesthesia:  Have a responsible person with you.  Do not drive a car. If you are alone, do not take public transportation.  Do not drink alcohol.  Do not take medicine that has not been prescribed by your health care provider.  Do not sign important papers or make important decisions.  You may resume a normal diet and activities as directed by your health care provider.  Change bandages (dressings) as directed.  If you have questions or problems that seem related to general anesthesia, call the hospital and ask for the anesthetist or anesthesiologist on call. SEEK MEDICAL CARE IF:  You have nausea and vomiting that continue the day after anesthesia.  You develop a rash. SEEK IMMEDIATE MEDICAL CARE IF:  You have difficulty breathing.  You have chest pain.  You have any allergic problems. Document Released: 01/05/2001 Document Revised: 06/01/2013 Document Reviewed: 04/14/2013  [facility] Patient Information 2014 [place], [place].   [facility]  UMBILICAL OR INGUINAL HERNIA REPAIR: POST OP INSTRUCTIONS  Always review your discharge instruction sheet given to you by the facility  where your surgery was performed. IF YOU HAVE DISABILITY OR FAMILY LEAVE FORMS, YOU MUST BRING THEM TO THE OFFICE FOR PROCESSING.   DO NOT GIVE THEM TO YOUR DOCTOR.  1. A  prescription for pain medication may be given to you upon discharge.  Take your pain medication as prescribed, if needed.  If narcotic pain medicine is not needed, then you may take acetaminophen (Tylenol) or ibuprofen (Advil) as needed. 2. Take your usually prescribed medications unless otherwise directed. 3. If you need a refill on your pain medication, please contact your pharmacy.  They will contact our office to request authorization. Prescriptions will not be filled after 5 pm or on week-ends. 4. You should follow a light diet the first 24 hours after arrival home, such as soup and crackers, etc.  Be sure to include lots of fluids daily.  Resume your normal diet the day after surgery. 5. Most patients will experience some swelling and bruising around the umbilicus or in the groin and scrotum.  Ice packs and reclining will help.  Swelling and bruising can take several days to resolve.  6. It is common to experience some constipation if taking pain medication after surgery.  Increasing fluid intake and taking a stool softener (such as Colace) will usually help or prevent this problem from occurring.  A mild laxative (Milk of Magnesia or Miralax) should be taken according to package directions if there are no bowel movements after 48 hours. 7. Unless discharge instructions indicate otherwise, you may remove your bandages 24-48 hours after surgery, and you may shower at that time.  You will have steri-strips (small skin tapes) in place directly over the incision.  These strips should be left on the skin for 7-10 days. 8. ACTIVITIES:  You may resume regular (light) daily activities beginning the next day--such as daily self-care, walking, climbing stairs--gradually increasing activities as tolerated.  You may have sexual intercourse  when it is comfortable.  Refrain from any heavy lifting or straining until approved by your doctor. a. You may drive when you are no longer taking prescription pain medication, you can comfortably wear a seatbelt, and you can safely maneuver your car and apply brakes. b. RETURN TO WORK:  2-3 weeks with light duty - no lifting over 15 lbs. 9. You should see your doctor in the office for a follow-up appointment approximately 2-3 weeks after your surgery.  Make sure that you call for this appointment within a day or two after you arrive home to insure a convenient appointment time. 10. OTHER INSTRUCTIONS:  __________________________________________________________________________________________________________________________________________________________________________________________  WHEN TO CALL YOUR DOCTOR: 1. Fever over 101.0 2. Inability to urinate 3. Nausea and/or vomiting 4. Extreme swelling or bruising 5. Continued bleeding from incision. 6. Increased pain, redness, or drainage from the incision  The clinic staff is available to answer your questions during regular business hours.  Please dont hesitate to call and ask to speak to one of the nurses for clinical concerns.  If you have a medical emergency, go to the nearest emergency room or call 911.  A surgeon from [facility] is always on call at the hospital   [address] [place], [place], [place]  [address] ?  [address], [place], [place]   [id number] [phone]    FAX [phone] Web site: [email or web address]

## 2014-06-08 NOTE — Op Note (Signed)
Preop diagnosis: Bilateral inguinal hernias Postop diagnosis: Bilateral indirect inguinal hernias Procedure performed: Laparoscopic preperitoneal bilateral inguinal hernia repairs with mesh Surgeon:Kinneth Fujiwara K. Anesthesia: Gen. Endotracheal Indications: This is a 53 year old male in good health who presents with small bilateral inguinal hernias. These have enlarged slightly and become uncomfortable. He presents now for bilateral inguinal hernia repair.  Description of procedure: The patient brought to the operating room and placed in a supine position on the operative table. After an adequate level of general anesthesia was obtained, a Foley catheter was placed under sterile technique. The patient's lower abdomen was shaved, prepped with ChloraPrep and draped sterile fashion. A timeout was taken to ensure the proper patient and proper procedure. About 2 cm below the umbilicus just to the right of midline I made a 1 cm transverse incision. Dissection was carried down to the anterior surface of the right rectus sheath. I made a transverse incision and anterior sheath and identified the rectus muscle. We retracted this laterally and entered the retro-muscular space. I inserted the space maker balloon and advanced it down to the pubic tubercle. We inflated the balloon and held in place for about 3 minutes for hemostasis. The balloon was deflated and removed. The balloon tip trocar was inflated and secured the trocar and placed in the preperitoneum. We insufflated CO2 maintaining a maximum pressure of 15 mm mercury. Two 5 mm ports were placed in the lower midline.We began working on the patient's left side. Blunt dissection was used to open the preperitoneal space all the way out to the patient's left hip. We dissected medially at the rim of the pelvis to the pubic symphysis. The bladder was identified inferiorly. No direct defect was noted. We reduced a relatively small fat containing indirect hernia sac away  from the spermatic cord. A left-sided Bard 3-D Max light mesh was inserted and deployed. We used 3 tacks from the secure strap device to secure the mesh. The lower edge of the mesh was tucked underneath the edge of the peritoneum. We then turned our attention to the right side. The preperitoneal space was opened. No direct defect was noted. The patient has a slightly larger indirect hernia sac which was completely reduced. We used a right sided Bard 3-D Max light mesh that was deployed and secured in similar fashion. The lower edge of the mesh was tucked underneath the edge of the peritoneum. We inspected for hemostasis. The pneumo-preperitoneum was then released and the peritoneum came to rest on top of the mesh. The trochars were removed. The fascia at the camera port was closed with 0 Vicryl. 4 Monocryl was used to close all skin incisions. Steri-Strips and clean dressings were applied. Foley catheter was removed. The patient was extubated and brought to the recovery room in stable condition. All sponge, instrument, and needle counts are correct.  Imogene Burn. Georgette Dover, MD, [facility]  General/ Trauma Surgery  06/08/2014 8:56 AM

## 2014-06-09 ENCOUNTER — Encounter (HOSPITAL_COMMUNITY): Payer: Self-pay | Admitting: Surgery

## 2014-06-09 ENCOUNTER — Other Ambulatory Visit (INDEPENDENT_AMBULATORY_CARE_PROVIDER_SITE_OTHER): Payer: Self-pay

## 2014-06-09 ENCOUNTER — Telehealth (INDEPENDENT_AMBULATORY_CARE_PROVIDER_SITE_OTHER): Payer: Self-pay

## 2014-06-09 DIAGNOSIS — Z978 Presence of other specified devices: Secondary | ICD-10-CM

## 2014-06-09 DIAGNOSIS — Z960 Presence of urogenital implants: Principal | ICD-10-CM

## 2014-06-09 NOTE — Telephone Encounter (Signed)
Called and spoke to [facility] urology regarding appointment for patient to remove foley catheter.  Patient had Bilateral Inguinal hernia Repair on 06/08/14 and had a foley catheter placed.  Patient given an RX for Flomax.  Spoke with [facility] and patient will need to keep catheter in place for 2-3 days to make sure bladder is functioning properly before catheter removal.  Reviewed the information with Dr. Georgette Dover to make aware that it will be Monday.  This will be okay since this is Urology recommendations.  Patient notified that [facility] will call to give time for appointment on Monday 06/12/14.  Patient verbalized understanding.

## 2014-06-09 NOTE — Telephone Encounter (Signed)
Pt called in concerned about his appt with [facility] b/c they are telling pt that it might be towards the end of next week with seeing pt for catheter removal. I explained to the pt that I would check with Alyse Low b/c this is not what her message relayed after speaking with [facility]. I called Christy to double check with her about the message below and she confirmed that triage in [facility] was going to call the pt. Alyse Low said she was given the impression that pt would be seen at the beginning of the week.   I called [facility] and spoke to Robin in triage to ask about the pt being seen earlier than the end of next week. Shirlean Mylar said right now there is not any early appt's for Monday but they always have cancelations. Shirlean Mylar said as soon as they get some cancelations they will call the pt to move the appt up earlier. I just asked to please call pt ASAP b/c he is very anxious.  I called pt back to let him know that I did call [facility] again to confirm trying to get pt seen at the beginning of the week. I advised pt that right now there is no appt's for Monday but they always have cancelations. I advised as soon as they get an earlier time they will call the pt. I advised pt as a back up plan if he does not hear from [facility] that he can call us on Monday for [place] to remove the catheter but under the understanding that if pt goes home to try to urinate on his own but unable to do so he might end up back in the ER getting another catheter placed. This is the whole reason why we get urology involved so we can try to keep our pt's out of the ER. Pt understands.

## 2014-06-09 NOTE — Telephone Encounter (Signed)
Pt s/p bilateral inguinal hernia on 06/08/14. Pt states that he went home with a foley catheter. Pt wanted to check in with Dr Georgette Dover to see if/when he needs to come in to get foley removed. Pt states that he lives near [place] and he could also go by there and have them remove if needed. Informed pt that I would speak with Dr Georgette Dover and we would contact him as soon as we received a response. Pt verbalized understanding

## 2014-06-10 ENCOUNTER — Telehealth (INDEPENDENT_AMBULATORY_CARE_PROVIDER_SITE_OTHER): Payer: Self-pay | Admitting: Surgery

## 2014-06-10 ENCOUNTER — Encounter (HOSPITAL_COMMUNITY): Payer: Self-pay | Admitting: Emergency Medicine

## 2014-06-10 ENCOUNTER — Emergency Department (HOSPITAL_COMMUNITY)
Admission: EM | Admit: 2014-06-10 | Discharge: 2014-06-10 | Disposition: A | Discharge status: Home / Self Care | Payer: BC Managed Care – PPO | Attending: Emergency Medicine | Admitting: Emergency Medicine

## 2014-06-10 DIAGNOSIS — Z466 Encounter for fitting and adjustment of urinary device: Secondary | ICD-10-CM

## 2014-06-10 DIAGNOSIS — Z880 Allergy status to penicillin: Secondary | ICD-10-CM | POA: Insufficient documentation

## 2014-06-10 DIAGNOSIS — Z79899 Other long term (current) drug therapy: Secondary | ICD-10-CM | POA: Diagnosis not present

## 2014-06-10 DIAGNOSIS — Z8719 Personal history of other diseases of the digestive system: Secondary | ICD-10-CM | POA: Insufficient documentation

## 2014-06-10 DIAGNOSIS — Z4682 Encounter for fitting and adjustment of non-vascular catheter: Secondary | ICD-10-CM | POA: Insufficient documentation

## 2014-06-10 DIAGNOSIS — N489 Disorder of penis, unspecified: Secondary | ICD-10-CM | POA: Diagnosis present

## 2014-06-10 DIAGNOSIS — N4889 Other specified disorders of penis: Secondary | ICD-10-CM

## 2014-06-10 DIAGNOSIS — Z8679 Personal history of other diseases of the circulatory system: Secondary | ICD-10-CM | POA: Insufficient documentation

## 2014-06-10 NOTE — Discharge Instructions (Signed)
Return to ER if worse, new symptoms, fevers, unable to void, abdominal pain, penile discharge, other concern.

## 2014-06-10 NOTE — ED Provider Notes (Signed)
CSN: [id number]     Arrival date & time 06/10/14  1539 History   First MD Initiated Contact with Patient 06/10/14 [phone]     Chief Complaint  Patient presents with  . Penis Pain     (Consider location/radiation/quality/duration/timing/severity/associated sxs/prior Treatment) The history is provided by the patient.  pt c/o burning discomfort in penis from foley.  States 2 days ago, had laparoscopic inguinal hernia repair, no complication.  States had to have foley placed a day ago due to urine retention - states similar issue w prior anesthesia. No hx chronic urinary difficulty, weak streak, hesitancy, bph, or other chronic gu issues. States no pain at incisions, has stopped taking his pain med. No fever or chills. No back or flank pain.  No scrotal or testicular pain.     Past Medical History  Diagnosis Date  . Rapid palpitations 02/23/2013  . Complication of anesthesia     trouble urinating after eye surgery 06  . Dysrhythmia     tachypalpitations  . Inguinal hernia     bilateral   Past Surgical History  Procedure Laterality Date  . Retinal detachment repair w/ scleral buckle le Right 06  . Inguinal hernia repair Bilateral 06/08/2014    Procedure: LAPAROSCOPIC BILATERAL INGUINAL HERNIA REPAIR ;  Surgeon: Imogene Burn. Georgette Dover, MD;  Location: [facility];  Service: General;  Laterality: Bilateral;  . Insertion of mesh Bilateral 06/08/2014    Procedure: INSERTION OF MESH;  Surgeon: Imogene Burn. Georgette Dover, MD;  Location: [facility];  Service: General;  Laterality: Bilateral;   No family history on file. History  Substance Use Topics  . Smoking status: Never Smoker   . Smokeless tobacco: Not on file  . Alcohol Use: No    Review of Systems  Constitutional: Negative for fever and chills.  Gastrointestinal: Negative for vomiting, abdominal pain and abdominal distention.  Genitourinary: Negative for flank pain.      Allergies  Penicillins  Home Medications   Prior to Admission medications    Medication Sig Start Date End Date Taking? Authorizing Provider  oxyCODONE-acetaminophen (PERCOCET/ROXICET) 5-325 MG per tablet Take 1 tablet by mouth every 4 (four) hours as needed for severe pain. 06/08/14   Imogene Burn. Tsuei, MD  tamsulosin (FLOMAX) 0.4 MG CAPS capsule Take 1 capsule (0.4 mg total) by mouth daily. 06/08/14   Imogene Burn. Tsuei, MD   BP 141/82  Pulse 73  Temp(Src) 98.3 F (36.8 C) (Oral)  Resp 18  SpO2 99% Physical Exam  Nursing note and vitals reviewed. Constitutional: He is oriented to person, place, and time. He appears well-developed and well-nourished. No distress.  Eyes: Conjunctivae are normal.  Neck: Neck supple. No tracheal deviation present.  Cardiovascular: Normal rate.   Pulmonary/Chest: Effort normal. No accessory muscle usage. No respiratory distress.  Abdominal: Soft. Bowel sounds are normal. He exhibits no distension and no mass. There is no tenderness. There is no rebound and no guarding.  Dressings intact. No abd tenderness.   Genitourinary:  Normal ext genitalia. No scrotal or testicular tenderness. No penile swelling or erythema. No purulent drainage. Foley intact, clear yellow urine in bag.   Musculoskeletal: Normal range of motion.  Neurological: He is alert and oriented to person, place, and time.  Skin: Skin is warm and dry. He is not diaphoretic.  Psychiatric: He has a normal mood and affect.    ED Course  Procedures (including critical care time) Labs Review   MDM  Dr Brantley Stage called in and requests we remove  pts foley.  Pt also requests foley removal.  Foley discontinued.   abd soft nt.  Reviewed nursing notes and prior charts for additional history.      Mirna Mires, MD 06/10/14 [phone]

## 2014-06-10 NOTE — Telephone Encounter (Signed)
Pt has foley 2 days after LAP BIH BY DR TSUEI.  Having a lot of burning at his urethra.  Advised he go to ED for evaluation and removal at this point.  Explained he may have further urinary retention but he cant get in to see urology at this point.  He is aware it might have to be replaced. ED contacted and discussed with Dr Ashok Cordia

## 2014-06-10 NOTE — ED Notes (Signed)
Pt from home c/o penis/urethra pain d/t foley. Pt is only here for catheter removal. Foley removed per Dr. Ashok Cordia. Pt has no other c/o and is A&O and in NAD

## 2015-03-27 ENCOUNTER — Ambulatory Visit (INDEPENDENT_AMBULATORY_CARE_PROVIDER_SITE_OTHER): Payer: BLUE CROSS/BLUE SHIELD | Admitting: Ophthalmology

## 2015-03-27 DIAGNOSIS — H338 Other retinal detachments: Secondary | ICD-10-CM

## 2015-03-27 DIAGNOSIS — H43813 Vitreous degeneration, bilateral: Secondary | ICD-10-CM

## 2015-03-27 DIAGNOSIS — H4423 Degenerative myopia, bilateral: Secondary | ICD-10-CM

## 2015-03-27 DIAGNOSIS — H33303 Unspecified retinal break, bilateral: Secondary | ICD-10-CM | POA: Diagnosis not present

## 2015-09-11 ENCOUNTER — Emergency Department (HOSPITAL_COMMUNITY): Payer: BLUE CROSS/BLUE SHIELD

## 2015-09-11 ENCOUNTER — Observation Stay (HOSPITAL_COMMUNITY)
Admission: EM | Admit: 2015-09-11 | Discharge: 2015-09-12 | Disposition: A | Discharge status: Home / Self Care | Payer: BLUE CROSS/BLUE SHIELD | Attending: Internal Medicine | Admitting: Internal Medicine

## 2015-09-11 ENCOUNTER — Encounter (HOSPITAL_COMMUNITY): Payer: Self-pay | Admitting: Emergency Medicine

## 2015-09-11 DIAGNOSIS — R079 Chest pain, unspecified: Secondary | ICD-10-CM | POA: Diagnosis not present

## 2015-09-11 DIAGNOSIS — I471 Supraventricular tachycardia: Secondary | ICD-10-CM

## 2015-09-11 DIAGNOSIS — R002 Palpitations: Principal | ICD-10-CM | POA: Insufficient documentation

## 2015-09-11 DIAGNOSIS — I209 Angina pectoris, unspecified: Secondary | ICD-10-CM | POA: Diagnosis not present

## 2015-09-11 DIAGNOSIS — G4733 Obstructive sleep apnea (adult) (pediatric): Secondary | ICD-10-CM | POA: Diagnosis not present

## 2015-09-11 DIAGNOSIS — R000 Tachycardia, unspecified: Secondary | ICD-10-CM

## 2015-09-11 LAB — COMPREHENSIVE METABOLIC PANEL
ALK PHOS: 77 U/L (ref 38–126)
ALT: 26 U/L (ref 17–63)
AST: 25 U/L (ref 15–41)
Albumin: 3.8 g/dL (ref 3.5–5.0)
Anion gap: 9 (ref 5–15)
BUN: 15 mg/dL (ref 6–20)
CALCIUM: 9.7 mg/dL (ref 8.9–10.3)
CO2: 28 mmol/L (ref 22–32)
Chloride: 103 mmol/L (ref 101–111)
Creatinine, Ser: 1.01 mg/dL (ref 0.61–1.24)
GFR calc non Af Amer: >60 mL/min (ref >60)
GLUCOSE: 104 mg/dL — AB (ref 65–99)
Potassium: 3.9 mmol/L (ref 3.5–5.1)
SODIUM: 140 mmol/L (ref 135–145)
TOTAL PROTEIN: 6.3 g/dL — AB (ref 6.5–8.1)
Total Bilirubin: 0.3 mg/dL (ref 0.3–1.2)

## 2015-09-11 LAB — CBC
HCT: 43.6 % (ref 39.0–52.0)
Hemoglobin: 15 g/dL (ref 13.0–17.0)
MCH: 31.4 pg (ref 26.0–34.0)
MCHC: 34.4 g/dL (ref 30.0–36.0)
MCV: 91.4 fL (ref 78.0–100.0)
Platelets: 194 10*3/uL (ref 150–400)
RBC: 4.77 MIL/uL (ref 4.22–5.81)
RDW: 12.3 % (ref 11.5–15.5)
WBC: 6.3 10*3/uL (ref 4.0–10.5)

## 2015-09-11 LAB — TSH: TSH: 1.259 u[IU]/mL (ref 0.350–4.500)

## 2015-09-11 LAB — TROPONIN I: Troponin I: <0.03 ng/mL (ref <0.031)

## 2015-09-11 LAB — T4, FREE: FREE T4: 1.09 ng/dL (ref 0.61–1.12)

## 2015-09-11 MED ORDER — SODIUM CHLORIDE 0.9 % IV SOLN
INTRAVENOUS | Status: DC
  Administered 2015-09-11: 500 mL via INTRAVENOUS

## 2015-09-11 MED ORDER — NITROGLYCERIN 0.4 MG SL SUBL
0.4000 mg | SUBLINGUAL_TABLET | SUBLINGUAL | Status: DC | PRN
  Administered 2015-09-11: 0.4 mg via SUBLINGUAL
  Filled 2015-09-11: qty 1

## 2015-09-11 NOTE — ED Notes (Signed)
Per EMS, pt was at theater when began feeling palpitations. Stepped outside to walk it off, but began feeling chest pain as well. Fire Dept on scene noted HR of 230. EMS noted some ST depression with first ECG, but during transport, pt seemed to improve and chest pain relieved. Given 324 ASA en route, no nitro.

## 2015-09-11 NOTE — ED Provider Notes (Signed)
CSN: [id number]     Arrival date & time 09/11/15  1926 History   First MD Initiated Contact with Patient 09/11/15 1932     Chief Complaint  Patient presents with  . Chest Pain     (Consider location/radiation/quality/duration/timing/severity/associated sxs/prior Treatment) Patient is a 54 y.o. male presenting with chest pain. The history is provided by the patient and the spouse.  Chest Pain Associated symptoms: palpitations   Associated symptoms: no abdominal pain, no back pain, no cough, no fever, no headache and not vomiting   Patient indicates at home, at rest, tonight had onset palpitations and chest discomfort. States he tried to check his pulse but was too fast to count.  First responders placed on pulse ox and told pt his hr was 230. Was sob during palpitations/cp episode, but otherwise denies dyspnea. No syncope. By EMS arrival, and being placed on monitor, his heart rate was in 110-120 range, sinus, and palpitations had improved. Pt indicates mild residual mid chest discomfort. Dull. Non radiating. No other recent chest pain, although did note episode arm discomfort last week. No hx cad. Reports stress test approx 10 yrs ago. No fam hx premature cad. No hx htn, high chol, or smoking. No hx dysrhythmia, svt or afib.       Past Medical History  Diagnosis Date  . Rapid palpitations 02/23/2013  . Complication of anesthesia     trouble urinating after eye surgery 06  . Dysrhythmia     tachypalpitations  . Inguinal hernia     bilateral   Past Surgical History  Procedure Laterality Date  . Retinal detachment repair w/ scleral buckle le Right 06  . Inguinal hernia repair Bilateral 06/08/2014    Procedure: LAPAROSCOPIC BILATERAL INGUINAL HERNIA REPAIR ;  Surgeon: Imogene Burn. Georgette Dover, MD;  Location: [facility];  Service: General;  Laterality: Bilateral;  . Insertion of mesh Bilateral 06/08/2014    Procedure: INSERTION OF MESH;  Surgeon: Imogene Burn. Georgette Dover, MD;  Location: [place];  Service:  General;  Laterality: Bilateral;   No family history on file. Social History  Substance Use Topics  . Smoking status: Never Smoker   . Smokeless tobacco: Not on file  . Alcohol Use: No    Review of Systems  Constitutional: Negative for fever and chills.  HENT: Negative for sore throat.   Eyes: Negative for redness.  Respiratory: Negative for cough.   Cardiovascular: Positive for chest pain and palpitations. Negative for leg swelling.  Gastrointestinal: Negative for vomiting and abdominal pain.  Genitourinary: Negative for dysuria and flank pain.  Musculoskeletal: Negative for back pain and neck pain.  Skin: Negative for rash.  Neurological: Negative for syncope and headaches.  Hematological: Does not bruise/bleed easily.  Psychiatric/Behavioral: Negative for confusion.      Allergies  Penicillins  Home Medications   Prior to Admission medications   Medication Sig Start Date End Date Taking? Authorizing Provider  oxyCODONE-acetaminophen (PERCOCET/ROXICET) 5-325 MG per tablet Take 1 tablet by mouth every 4 (four) hours as needed for severe pain. 06/08/14   Donnie Mesa, MD  tamsulosin (FLOMAX) 0.4 MG CAPS capsule Take 1 capsule (0.4 mg total) by mouth daily. 06/08/14   Donnie Mesa, MD   BP 130/113 mmHg  Pulse 85  Temp(Src) 98.8 F (37.1 C) (Oral)  Resp 22  Ht 6' (1.829 m)  Wt 76.204 kg  BMI 22.78 kg/m2  SpO2 100% Physical Exam  Constitutional: He is oriented to person, place, and time. He appears well-developed and well-nourished. No  distress.  HENT:  Head: Atraumatic.  Eyes: Conjunctivae are normal. Pupils are equal, round, and reactive to light. No scleral icterus.  Neck: Neck supple. No tracheal deviation present. No thyromegaly present.  Cardiovascular: Normal rate, regular rhythm, normal heart sounds and intact distal pulses.  Exam reveals no gallop and no friction rub.   No murmur heard. Pulmonary/Chest: Effort normal and breath sounds normal. No accessory  muscle usage. No respiratory distress.  Abdominal: Soft. Bowel sounds are normal. He exhibits no distension. There is no tenderness.  Musculoskeletal: Normal range of motion. He exhibits no edema.  Neurological: He is alert and oriented to person, place, and time.  Skin: Skin is warm and dry.  Psychiatric: He has a normal mood and affect.  Nursing note and vitals reviewed.   ED Course  Procedures (including critical care time) Labs Review  Results for orders placed or performed during the hospital encounter of 09/11/15  Troponin I  Result Value Ref Range   Troponin I <0.03 <0.031 ng/mL  CBC  Result Value Ref Range   WBC 6.3 4.0 - 10.5 K/uL   RBC 4.77 4.22 - 5.81 MIL/uL   Hemoglobin 15.0 13.0 - 17.0 g/dL   HCT 43.6 39.0 - 52.0 %   MCV 91.4 78.0 - 100.0 fL   MCH 31.4 26.0 - 34.0 pg   MCHC 34.4 30.0 - 36.0 g/dL   RDW 12.3 11.5 - 15.5 %   Platelets 194 150 - 400 K/uL  Comprehensive metabolic panel  Result Value Ref Range   Sodium 140 135 - 145 mmol/L   Potassium 3.9 3.5 - 5.1 mmol/L   Chloride 103 101 - 111 mmol/L   CO2 28 22 - 32 mmol/L   Glucose, Bld 104 (H) 65 - 99 mg/dL   BUN 15 6 - 20 mg/dL   Creatinine, Ser 1.01 0.61 - 1.24 mg/dL   Calcium 9.7 8.9 - 10.3 mg/dL   Total Protein 6.3 (L) 6.5 - 8.1 g/dL   Albumin 3.8 3.5 - 5.0 g/dL   AST 25 15 - 41 U/L   ALT 26 17 - 63 U/L   Alkaline Phosphatase 77 38 - 126 U/L   Total Bilirubin 0.3 0.3 - 1.2 mg/dL   GFR calc non Af Amer >60 >60 mL/min   GFR calc Af Amer >60 >60 mL/min   Anion gap 9 5 - 15  TSH  Result Value Ref Range   TSH 1.259 0.350 - 4.500 uIU/mL  T4, free  Result Value Ref Range   Free T4 1.09 0.61 - 1.12 ng/dL   Dg Chest Port 1 View  09/11/2015  CLINICAL DATA:  Palpitations EXAM: PORTABLE CHEST 1 VIEW COMPARISON:  08/21/2006 FINDINGS: The heart size and mediastinal contours are within normal limits. Both lungs are clear. The visualized skeletal structures are unremarkable. IMPRESSION: No active disease.  Electronically Signed   By: Marybelle Killings M.D.   On: 09/11/2015 20:10       I have personally reviewed and evaluated these images and lab results as part of my medical decision-making.   EKG Interpretation   Date/Time:  Tuesday September 11 2015 19:29:41 EST Ventricular Rate:  87 PR Interval:  146 QRS Duration: 98 QT Interval:  366 QTC Calculation: 440 R Axis:   87 Text Interpretation:  Sinus rhythm Atrial premature complexes No  significant change since last tracing Confirmed by Ashok Cordia  MD, Lennette Bihari  ([id number]) on 09/11/2015 7:33:09 PM      MDM   Iv ns. Continuous  pulse ox and monitor. o2 [place]. Labs.  Cxr.  Reviewed nursing notes and prior charts for additional history.   Recheck, remains in nsr on monitor, hr 70's, bp normal.  No recurrence of tachycardia, no chest pain or sob.  Cardiology consulted re hr 230, chest pain. ?whether pain was related to rate, ?whether tachycardia svt vs other dysthythmia.   Dispo per cardiology.      Lajean Saver, MD 09/11/15 2211

## 2015-09-11 NOTE — H&P (Signed)
Patient ID: Blake Sims MRN: [id number], DOB/AGE: 55-Mar-1962   Admit date: 09/11/2015   Primary Physician: Orpah Melter, MD Primary Cardiologist: Caryl Comes  Pt. Profile:  16M with OSA and palpitations who presents with palpitations and CP.   Problem List  Past Medical History  Diagnosis Date  . Rapid palpitations 02/23/2013  . Complication of anesthesia     trouble urinating after eye surgery 06  . Dysrhythmia     tachypalpitations  . Inguinal hernia     bilateral    Past Surgical History  Procedure Laterality Date  . Retinal detachment repair w/ scleral buckle le Right 06  . Inguinal hernia repair Bilateral 06/08/2014    Procedure: LAPAROSCOPIC BILATERAL INGUINAL HERNIA REPAIR ;  Surgeon: Imogene Burn. Georgette Dover, MD;  Location: [facility];  Service: General;  Laterality: Bilateral;  . Insertion of mesh Bilateral 06/08/2014    Procedure: INSERTION OF MESH;  Surgeon: Imogene Burn. Georgette Dover, MD;  Location: [facility];  Service: General;  Laterality: Bilateral;     Allergies  Allergies  Allergen Reactions  . Penicillins Other (See Comments)    Swelling and itching  Has patient had a PCN reaction causing immediate rash, facial/tongue/throat swelling, SOB or lightheadedness with hypotension: No Has patient had a PCN reaction causing severe rash involving mucus membranes or skin necrosis: No Has patient had a PCN reaction that required hospitalization No Has patient had a PCN reaction occurring within the last 10 years: No If all of the above answers are "NO", then may proceed with Cephalosporin use.    HPI  16M with OSA and palpitations who presents with palpitations and CP.   Mr. [name] reports that today while at the theatre he developed abrupt onset of palpitations. He developed associated CP with possible radiation down the left arm and lightneadedness. Pain was 6/10 at its worst. He then left the theatre and called 911. He took his pulse and noted it was rapid and regular.   First responders arrived and found his pulse to be 230bpm. By the time a monitor was placed, he had converted to sinus tachycardia.  Total episode duration was ~20 minutes. Initial ECGs demonstrated diffuse, mild STD. He was given 324mg  ASA by EMS.   On arrival to the ER, he was hemodynamically stable. HR 85, BP 130/113(?), 100% on RA. Although the palpitations subsided with termination of the tachycardia, the CP persistent at a low level (2/10). Labs were notable for K 3.9, Cr 1.01, TnI <0.03, TSH 1.259.  CXR was unremarkable. ECG demonstrated NSR with PACs. Compared to 05/31/14, rate has increased and PACs are now present.   Mr. Hurford reports a history of skipped beats. He had a 30 second episode of sustained palpitations a few years ago. He saw Dr. Caryl Comes for this and AVNRT was suspected. Vagal maneuvers were reviewed and PRN verapamil was prescribed. 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 He drinks a glass of wine per day and has 5 cups of coffee and a large unsweatened tea on most days. He is remarried and has children. He was recently diagnosed with OSA and starting using a CPAP last night. He had a normal stress test years ago for what was ultimately attributed to stress in the setting of a divorce. He noted an episode about 1 week ago where his left shoulder felt "funny" about 1 week ago.   Home Medications  Prior to Admission medications   Medication Sig Start Date End Date Taking?  Authorizing Provider  oxyCODONE-acetaminophen (PERCOCET/ROXICET) 5-325 MG per tablet Take 1 tablet by mouth every 4 (four) hours as needed for severe pain. 06/08/14   Donnie Mesa, MD  tamsulosin (FLOMAX) 0.4 MG CAPS capsule Take 1 capsule (0.4 mg total) by mouth daily. 06/08/14   Donnie Mesa, MD    Family History  History reviewed. No pertinent family history.  Social History  Social History   Social History  . Marital Status: Married    Spouse Name: N/A  . Number of Children: N/A    . Years of Education: N/A   Occupational History  . Not on file.   Social History Main Topics  . Smoking status: Never Smoker   . Smokeless tobacco: Not on file  . Alcohol Use: No  . Drug Use: No  . Sexual Activity: Not on file   Other Topics Concern  . Not on file   Social History Narrative     Review of Systems General:  No chills, fever, night sweats or weight changes.  Cardiovascular:  See HPI Dermatological: No rash, lesions/masses Respiratory: No cough, dyspnea Urologic: No hematuria, dysuria Abdominal:   No nausea, vomiting, diarrhea, bright red blood per rectum, melena, or hematemesis Neurologic:  No visual changes, wkns, changes in mental status. All other systems reviewed and are otherwise negative except as noted above.  Physical Exam  Blood pressure 130/113, pulse 85, temperature 98.8 F (37.1 C), temperature source Oral, resp. rate 22, height 6' (1.829 m), weight 76.204 kg (168 lb), SpO2 100 %.  General: Pleasant, NAD Psych: Normal affect. Neuro: Alert and oriented X 3. Moves all extremities spontaneously. HEENT: Normal  Neck: Supple without bruits or JVD. Lungs:  Resp regular and unlabored, CTA. Heart: RRR no s3, s4, or murmurs. Abdomen: Soft, non-tender, non-distended, BS + x 4.  Extremities: No clubbing, cyanosis or edema. DP/PT/Radials 2+ and equal bilaterally.  Labs  Troponin (Point of Care Test) No results for input(s): TROPIPOC in the last 72 hours.  Recent Labs  09/11/15 2015  TROPONINI <0.03   Lab Results  Component Value Date   WBC 6.3 09/11/2015   HGB 15.0 09/11/2015   HCT 43.6 09/11/2015   MCV 91.4 09/11/2015   PLT 194 09/11/2015    Recent Labs Lab 09/11/15 2015  NA 140  K 3.9  CL 103  CO2 28  BUN 15  CREATININE 1.01  CALCIUM 9.7  PROT 6.3*  BILITOT 0.3  ALKPHOS 77  ALT 26  AST 25  GLUCOSE 104*   No results found for: CHOL, HDL, LDLCALC, TRIG No results found for: DDIMER   Radiology/Studies  Dg Chest Port 1  View  09/11/2015  CLINICAL DATA:  Palpitations EXAM: PORTABLE CHEST 1 VIEW COMPARISON:  08/21/2006 FINDINGS: The heart size and mediastinal contours are within normal limits. Both lungs are clear. The visualized skeletal structures are unremarkable. IMPRESSION: No active disease. Electronically Signed   By: Marybelle Killings M.D.   On: 09/11/2015 20:10    ECG  ECG demonstrated NSR with PACs. Compared to 05/31/14, rate has increased and PACs are now present. No delta wave.   ASSESSMENT AND PLAN  38M with OSA and palpitations who presents with palpitations and CP. The description of the palpitations are most suggestive of an AVNRT based on rapid onset and offset, regular (albeit tachycardic) rate, and a presumed structurally normal heart. Of concern is the fact that the chest pain persistent for ~3 hours after termination of the tachycardia.   In the setting of  mild STD on the post conversion ECG and the episode of shoulder discomfort last week, it is reasonable to admit for observation to obtain an echo and possibly a stress test. If the heart is not structurally normal and/or CAD is present, then the differential diagnosis must expand to include VT, and then the work-up and management would change substantially.   Cycle troponins TTE in AM Metoprolol 12.5mg  BID to reduce likelihood of tachycardia recurrence ASA 81mg  until CAD is ruled out lipids, A1c NPO for possible stress    Signed, Lamar Sprinkles, MD 09/11/2015, 10:24 PM

## 2015-09-11 NOTE — ED Notes (Signed)
Chest pain resolved. Pt c/o some acid reflux. Felt faint after nitro tab, but resolved after a few minutes.

## 2015-09-12 ENCOUNTER — Observation Stay (HOSPITAL_BASED_OUTPATIENT_CLINIC_OR_DEPARTMENT_OTHER): Payer: BLUE CROSS/BLUE SHIELD

## 2015-09-12 ENCOUNTER — Encounter (HOSPITAL_COMMUNITY): Payer: Self-pay | Admitting: Physician Assistant

## 2015-09-12 DIAGNOSIS — R072 Precordial pain: Secondary | ICD-10-CM | POA: Diagnosis not present

## 2015-09-12 DIAGNOSIS — R002 Palpitations: Secondary | ICD-10-CM

## 2015-09-12 DIAGNOSIS — R000 Tachycardia, unspecified: Secondary | ICD-10-CM | POA: Diagnosis not present

## 2015-09-12 DIAGNOSIS — G4733 Obstructive sleep apnea (adult) (pediatric): Secondary | ICD-10-CM | POA: Diagnosis not present

## 2015-09-12 DIAGNOSIS — R079 Chest pain, unspecified: Secondary | ICD-10-CM | POA: Diagnosis not present

## 2015-09-12 DIAGNOSIS — I471 Supraventricular tachycardia: Secondary | ICD-10-CM | POA: Diagnosis not present

## 2015-09-12 HISTORY — DX: Obstructive sleep apnea (adult) (pediatric): G47.33

## 2015-09-12 LAB — LIPID PANEL
Cholesterol: 183 mg/dL (ref 0–200)
HDL: 53 mg/dL (ref >40)
LDL CALC: 112 mg/dL — AB (ref 0–99)
TRIGLYCERIDES: 91 mg/dL (ref <150)
Total CHOL/HDL Ratio: 3.5 RATIO
VLDL: 18 mg/dL (ref 0–40)

## 2015-09-12 LAB — TROPONIN I

## 2015-09-12 MED ORDER — DILTIAZEM HCL 60 MG PO TABS: 60.0000 mg | ORAL_TABLET | Freq: Every day | ORAL | Status: DC | PRN

## 2015-09-12 MED ORDER — ASPIRIN EC 81 MG PO TBEC
81.0000 mg | DELAYED_RELEASE_TABLET | Freq: Every day | ORAL | Status: DC
  Administered 2015-09-12: 81 mg via ORAL
  Filled 2015-09-12: qty 1

## 2015-09-12 MED ORDER — VERAPAMIL HCL 40 MG PO TABS
40.0000 mg | ORAL_TABLET | Freq: Every day | ORAL | Status: DC | PRN
  Filled 2015-09-12: qty 1

## 2015-09-12 MED ORDER — VERAPAMIL HCL 40 MG PO TABS: 40.0000 mg | ORAL_TABLET | Freq: Every day | ORAL | Status: DC | PRN

## 2015-09-12 MED ORDER — ACETAMINOPHEN 325 MG PO TABS: 650.0000 mg | ORAL_TABLET | ORAL | Status: DC | PRN

## 2015-09-12 MED ORDER — METOPROLOL TARTRATE 12.5 MG HALF TABLET
12.5000 mg | ORAL_TABLET | Freq: Two times a day (BID) | ORAL | Status: DC
  Administered 2015-09-12: 12.5 mg via ORAL
  Filled 2015-09-12 (×2): qty 1

## 2015-09-12 MED ORDER — ALUM & MAG HYDROXIDE-SIMETH 200-200-20 MG/5ML PO SUSP
30.0000 mL | ORAL | Status: DC | PRN
  Administered 2015-09-12: 30 mL via ORAL
  Filled 2015-09-12: qty 30

## 2015-09-12 MED ORDER — ASPIRIN 81 MG PO TBEC: 81.0000 mg | DELAYED_RELEASE_TABLET | Freq: Every day | ORAL | Status: DC

## 2015-09-12 MED ORDER — ONDANSETRON HCL 4 MG/2ML IJ SOLN: 4.0000 mg | Freq: Four times a day (QID) | INTRAMUSCULAR | Status: DC | PRN

## 2015-09-12 NOTE — Progress Notes (Signed)
Patient: Blake Sims / Admit Date: 09/11/2015 / Date of Encounter: 09/12/2015, 11:41 AM   Subjective: Feeling better this AM, no further palpitations.  Mild residual chest discomfort. Not worse with inspiration, movement, palpitation.   Objective: Telemetry: NSR/SB HR upper 40s-60s Physical Exam: Blood pressure 98/65, pulse 58, temperature 98.4 F (36.9 C), temperature source Oral, resp. rate 20, height 6' (1.829 m), weight 171 lb 4.8 oz (77.7 kg), SpO2 98 %. General: Well developed, well nourished WM in no acute distress. Head: Normocephalic, atraumatic, sclera non-icteric, no xanthomas, nares are without discharge. Neck: Negative for carotid bruits. JVP not elevated. Lungs: Clear bilaterally to auscultation without wheezes, rales, or rhonchi. Breathing is unlabored. Heart: RRR S1 S2 without murmurs, rubs, or gallops.  Abdomen: Soft, non-tender, non-distended with normoactive bowel sounds. No rebound/guarding. Extremities: No clubbing or cyanosis. No edema. Distal pedal pulses are 2+ and equal bilaterally. Neuro: Alert and oriented X 3. Moves all extremities spontaneously. Psych:  Responds to questions appropriately with a normal affect.   Intake/Output Summary (Last 24 hours) at 09/12/15 1141 Last data filed at 09/12/15 0900  Gross per 24 hour  Intake      0 ml  Output      0 ml  Net      0 ml    Inpatient Medications:  . aspirin EC  81 mg Oral Daily  . metoprolol tartrate  12.5 mg Oral BID   Infusions:  . sodium chloride 500 mL (09/11/15 2051)    Labs:  Recent Labs  09/11/15 2015  NA 140  K 3.9  CL 103  CO2 28  GLUCOSE 104*  BUN 15  CREATININE 1.01  CALCIUM 9.7    Recent Labs  09/11/15 2015  AST 25  ALT 26  ALKPHOS 77  BILITOT 0.3  PROT 6.3*  ALBUMIN 3.8    Recent Labs  09/11/15 2015  WBC 6.3  HGB 15.0  HCT 43.6  MCV 91.4  PLT 194    Recent Labs  09/11/15 2015 09/12/15 0120 09/12/15 0500 09/12/15 0724  TROPONINI <0.03 <0.03  <0.03 <0.03   Invalid input(s): POCBNP No results for input(s): HGBA1C in the last 72 hours.   Radiology/Studies:  Dg Chest Port 1 View  09/11/2015  CLINICAL DATA:  Palpitations EXAM: PORTABLE CHEST 1 VIEW COMPARISON:  08/21/2006 FINDINGS: The heart size and mediastinal contours are within normal limits. Both lungs are clear. The visualized skeletal structures are unremarkable. IMPRESSION: No active disease. Electronically Signed   By: Marybelle Killings M.D.   On: 09/11/2015 20:10     Assessment and Plan  22M with OSA, palpitations (remotely suspected AVNRT) admitted with CP and palpitations - reported HR 230bpm per EMS but was back in sinus tach by the time monitor was placed.  1. Palpitations - TSH wnl. Suspect recurrence of SVT. AliveCor discussed. Holter/event monitoring may be low yield given infrequency of events. Does not appear that BP/HR will tolerate even standing low-dose metoprolol so may need to use PRN approach.  2. Chest pain - occurred in the setting of tachypalpitations. R/o for MI. Poverty of cardiac risk factors. Will d/w Dr. Meda Coffee.  3. OSA - patient started using CPAP last night.  Signed, Melina Copa PA-C Pager: [phone]   The patient was seen, examined and discussed with Melina Copa, PA-C and I agree with the above.   54 year old male with no prior h/o CAD, prior palpitations, presumed SVT, seen by Dr Caryl Comes about 3 years ago, no arrhythmia during  Holter, prescribed Verapamil PRN. No palpitations until yesterday with HR 230 BPM with presyncope and chest pain, resolved now.  No FH of premature CAD, normal lipids, very active with no prior chest pain. We will discharge home with PRN Verapamil, follow up with Dr Caryl Comes, advised to purchase AliveCor for arrhythmia detection.   Dorothy Spark 09/12/2015

## 2015-09-12 NOTE — Progress Notes (Signed)
Pt in stable condition, IV removed Cardiac monitor DC, CCMD notified, discharge instructions were discussed with pt,pt verbalises understanding, pt belongings at bed side, pt transported by an NT via wheelchair off the unit. Oren Beckmann, RN

## 2015-09-12 NOTE — Discharge Summary (Signed)
Discharge Summary   Patient ID: Blake Sims,  MRN: [id number], DOB/AGE: 55-Jan-1962 55 y.o.  Admit date: 09/11/2015 Discharge date: 09/12/2015  Primary Care Provider: Orpah Melter Primary Cardiologist: Dr. Caryl Comes  Discharge Diagnoses    Principal Problem:   Rapid palpitations Active Problems:   Chest pain   OSA (obstructive sleep apnea)   Allergies Allergies  Allergen Reactions  . Penicillins Other (See Comments)    Swelling and itching  Has patient had a PCN reaction causing immediate rash, facial/tongue/throat swelling, SOB or lightheadedness with hypotension: No Has patient had a PCN reaction causing severe rash involving mucus membranes or skin necrosis: No Has patient had a PCN reaction that required hospitalization No Has patient had a PCN reaction occurring within the last 10 years: No If all of the above answers are "NO", then may proceed with Cephalosporin use.    Diagnostic Studies/Procedures    2D Echo 09/12/15: - Left ventricle: The cavity size was normal. Wall thickness was normal. Systolic function was normal. The estimated ejectionfraction was in the range of 60% to 65%. Wall motion was normal;there were no regional wall motion abnormalities CXR 11/291/6: No active disease.  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 He has history of 30-seconds of palpitations in 2014, frog-positive, at which time he saw Dr. Caryl Comes who suspected this to represent AVNRT. The patient was prescribed PRN verapamil but never had to take this. Regarding this admission, he was at the theater yesterday when he developed abrupt onset of palpitations. He developed associated CP with possible radiation down the left arm and lightneadedness. Pain was 6/10 at its worst. He then left the theatre and called 911. He  took his pulse and noted it was rapid and regular.First responders arrived and found his pulse to be 230bpm. By the time a monitor was placed, he had converted to sinus tachycardia.Total episode duration was ~20 minutes. Initial ECGs demonstrated diffuse, mild STD. He was given 324mg  ASA by EMS. On arrival to the ER, he was hemodynamically stable. HR 85, BP 130/113(?), 100% on RA. Although the palpitations subsided with termination of the tachycardia, the CP persistent at a low level (2/10) atypical soreness. Labs were notable for K 3.9, Cr 1.01, TnI <0.03, TSH 1.259. CXR was unremarkable. F/u ECG demonstrated NSR with PACs. He reported drinking a glass of wine per day and has 5 cups of coffee and a large unsweatened tea on most days. No family history of CAD. He denied tobacco abuse. Lipids were notable for LDL 112, otherwise unremarkable. Troponins remained negative. He was started on low dose metoprolol 12.5mg  BID but unfortunately his BP and HR prohibited further regular dosing (BP 98/65), HR 50s. Thus the decision was made to revert back to the original plan per 2014 which would be a pill-in-pocket approach with PRN verapamil as needed. Chest pain was felt atypical, possibly residual from his rapid tachycardia last night. He had no high risk features and EKG this AM was unremarkable. A 2D echo was done prior to discharge which was normal - EF 60-65%, no RWMA, no significant valvular abnormalities. Will plan on discharge home with EP f/u. We will continue baby aspirin daily for now. He was advised to decrease caffeine intake and wear CPAP as prescribed. We also discussed the AliveCor smartphone app for future monitoring. Event monitoring was felt to be  low yield given infrequency of events at this time. Dr. Meda Coffee has seen and examined the patient today and feels he is stable for discharge.  Consultants: N/a _____________  Discharge Vitals Blood pressure 98/65, pulse 58, temperature 98.4 F (36.9 C),  temperature source Oral, resp. rate 20, height 6' (1.829 m), weight 171 lb 4.8 oz (77.7 kg), SpO2 98 %.  Filed Weights   09/11/15 1934 09/12/15 0012  Weight: 168 lb (76.204 kg) 171 lb 4.8 oz (77.7 kg)   _____________  Labs     CBC  Recent Labs  09/11/15 2015  WBC 6.3  HGB 15.0  HCT 43.6  MCV 91.4  PLT [id number]   Basic Metabolic Panel  Recent Labs  09/11/15 2015  NA 140  K 3.9  CL 103  CO2 28  GLUCOSE 104*  BUN 15  CREATININE 1.01  CALCIUM 9.7   Liver Function Tests  Recent Labs  09/11/15 2015  AST 25  ALT 26  ALKPHOS 77  BILITOT 0.3  PROT 6.3*  ALBUMIN 3.8   Cardiac Enzymes  Recent Labs  09/12/15 0120 09/12/15 0500 09/12/15 0724  TROPONINI <0.03 <0.03 <0.03   Fasting Lipid Panel  Recent Labs  09/12/15 0120  CHOL 183  HDL 53  LDLCALC 112*  TRIG 91  CHOLHDL 3.5   Thyroid Function Tests  Recent Labs  09/11/15 2015  TSH 1.259   _____________  Disposition   Pt is being discharged home today in good condition. _____________  Follow-up Plans & Appointments    Follow-up Information    Follow up with Baldwin Jamaica, PA-C.   Specialty:  Cardiology   Why:  [facility] - 10/10/15 at [place] is one of the PAs that works with Dr. Caryl Comes.   Contact information:   [address] [place] [address] [phone]      Discharge Instructions    Diet - low sodium heart healthy    Complete by:  As directed      Increase activity slowly    Complete by:  As directed   We reviewed your old records from 2014 and Dr. Caryl Comes had prescribed verapamil 40mg  daily as needed for palpitations. We sent in an updated prescription to your pharmacy.   Please decrease your caffeine intake. Make sure to wear your CPAP.  You may choose to look into the device for your SmartPhone called AliveCor as Dr. Meda Coffee discussed. This can help record an EKG tracing quickly if the palpitations come back.           Discharge Medications   Current  Discharge Medication List    START taking these medications   Details  aspirin EC 81 MG EC tablet Take 1 tablet (81 mg total) by mouth daily.    verapamil (CALAN) 40 MG tablet Take 1 tablet (40 mg total) by mouth daily as needed (rapid palpitations). Qty: 10 tablet, Refills: 0      CONTINUE these medications which have NOT CHANGED   Details  acetaminophen (TYLENOL) 325 MG tablet Take 650 mg by mouth every 6 (six) hours as needed for mild pain, moderate pain or headache.    Multiple Vitamin (MULTIVITAMIN WITH MINERALS) TABS tablet Take 1 tablet by mouth daily.      STOP taking these medications     oxyCODONE-acetaminophen (PERCOCET/ROXICET) 5-325 MG per tablet - patient not taking      tamsulosin (FLOMAX) 0.4 MG CAPS capsule - patient not taking  ____________   Outstanding Labs/Studies   A1C still pending at time of DC.  Duration of Discharge Encounter   Greater than 30 minutes including physician time.  Signed, Melina Copa PA-C 09/12/2015, 12:28 PM

## 2015-09-12 NOTE — Progress Notes (Signed)
Pt admitted from [facility] ED to 2w26,chest pain free,SR rhythm at 72,patient oriented to room.

## 2015-09-12 NOTE — Progress Notes (Signed)
  Echocardiogram 2D Echocardiogram has been performed.  Blake Sims M 09/12/2015, 4:01 PM

## 2015-09-13 LAB — HEMOGLOBIN A1C
HEMOGLOBIN A1C: 5.5 % (ref 4.8–5.6)
Mean Plasma Glucose: 111 mg/dL

## 2015-10-09 NOTE — Progress Notes (Signed)
Cardiology Office Note Date:  10/10/2015  Patient ID:  Blake Sims, DOB [birth year], MRN [id number] PCP:  Orpah Melter, MD  Electrophysiologist: Dr. Caryl Comes    Chief Complaint: post-hospital f/u  History of Present Illness: Blake Sims is a 54 y.o. male with history of palpitations was at the hospital November 2016 for event of palpitations.   Records reviewed/discharge summary notes; he was at the theater yesterday when he developed abrupt onset of palpitations. He developed associated CP with possible radiation down the left arm and lightneadedness. Pain was 6/10 at its worst. He then left the theatre and called 911. He took his pulse and noted it was rapid and regular.First responders arrived and found his pulse to be 230bpm. By the time a monitor was placed, he had converted to sinus tachycardia.Total episode duration was ~20 minutes. Initial ECGs demonstrated diffuse, mild STD. He was given 324mg  ASA by EMS. On arrival to the ER, he was hemodynamically stable. HR 85, BP 130/113(?), 100% on RA. Although the palpitations subsided with termination of the tachycardia, the CP persistent at a low level (2/10) atypical soreness. Labs were notable for K 3.9, Cr 1.01, TnI <0.03, TSH 1.259. CXR was unremarkable. F/u ECG demonstrated NSR with PACs. Troponins remained negative. He was started on low dose metoprolol 12.5mg  BID but unfortunately his BP and HR prohibited further regular dosing (BP 98/65), HR 50s. Thus the decision was made to revert back to the original plan per 2014 which would be a pill-in-pocket approach with PRN verapamil as needed. Chest pain was felt atypical, possibly residual from his rapid tachycardia last night. He had no high risk features and EKG this AM was unremarkable. A 2D echo was done prior to discharge which was normal - EF 60-65%, no RWMA, no significant valvular abnormalities.    The patient comes in today for f/u.  He reports he had just been  started on CPAP for significant snoring, his first use of CPAP was the night prior to his event of palpitations, and that since he has been using it he is waking with diffuse chest discomfort, he denies any day time or exertional CP.  It is described as a gripping and general discomfort, he does not feel SOB with it, it is not radiating, no associated symptoms with it and not associated with palpitations.  He has attributed this chest discomfort to the CPAP machine/use and has not felt the need to seek attention for it.  He has started an OTC PPI though he cannot recall which one without much improvement if any at all.  He sees Dr. Isidoro Donning with [facility] today for f/u and discussion regarding this.  His last visit with Dr. Caryl Comes in 2014, the patient had wanted to hold off on any intervention or further evaluation due to the infrequent and brief episodes. His last event prior to November 2016 was no significant episodes until this one.  He states he saw EP in 2014 at the encouragement of a friend who was a nurse at the time to check on his c/o palpitations, back then they were a few seconds here and there, and since then nothing more then a couple extra beats infrequently.  This event in November the first of any significant duration.   Past Medical History  Diagnosis Date  . Rapid palpitations 02/23/2013    a. Tachypalps, frog positive 2014 - suspected AVNRT but not documented. b. Recurrent palps 08/2015, again ceased before they could be recorded.  Marland Kitchen  Complication of anesthesia     trouble urinating after eye surgery 06  . Inguinal hernia     bilateral    Past Surgical History  Procedure Laterality Date  . Retinal detachment repair w/ scleral buckle le Right 06  . Inguinal hernia repair Bilateral 06/08/2014    Procedure: LAPAROSCOPIC BILATERAL INGUINAL HERNIA REPAIR ;  Surgeon: Imogene Burn. Georgette Dover, MD;  Location: [place];  Service: General;  Laterality: Bilateral;  . Insertion of mesh  Bilateral 06/08/2014    Procedure: INSERTION OF MESH;  Surgeon: Imogene Burn. Georgette Dover, MD;  Location: [place];  Service: General;  Laterality: Bilateral;    Current Outpatient Prescriptions  Medication Sig Dispense Refill  . acetaminophen (TYLENOL) 325 MG tablet Take 650 mg by mouth every 6 (six) hours as needed for mild pain, moderate pain or headache.    Marland Kitchen CIALIS 10 MG tablet Take 10 mg by mouth daily as needed for erectile dysfunction.   4  . Multiple Vitamin (MULTIVITAMIN WITH MINERALS) TABS tablet Take 1 tablet by mouth daily.    . verapamil (CALAN) 40 MG tablet Take 1 tablet (40 mg total) by mouth daily as needed (rapid palpitations). 10 tablet 0   No current facility-administered medications for this visit.    Allergies:   Penicillins   Social History:  The patient  reports that he has never smoked. He does not have any smokeless tobacco history on file. He reports that he does not drink alcohol or use illicit drugs.   Family History:  The patient's family history is essentially negative, his parents and a sister are alive and healthy.  ROS:  Please see the history of present illness.    All other systems are reviewed and otherwise negative.   PHYSICAL EXAM:  VS:  BP 100/62 mmHg  Pulse 59  Ht 6' (1.829 m)  Wt 179 lb (81.194 kg)  BMI 24.27 kg/m2  SpO2 98% BMI: Body mass index is 24.27 kg/(m^2). Well nourished, well developed, in no acute distress HEENT: normocephalic, atraumatic Neck: no JVD, carotid bruits or masses Cardiac:  normal S1, S2; RRR; no significant murmurs, no rubs, or gallops Lungs:  clear to auscultation bilaterally, no wheezing, rhonchi or rales Abd: soft, nontender MS: no deformity or atrophy Ext: no edema Skin: warm and dry, no rash Neuro:  No gross deficits appreciated Psych: euthymic mood, full affect   EKG:  Done today shows SB, no ischemic changes  09/12/15: Echocardiogram Study Conclusions - Left ventricle: The cavity size was normal. Wall thickness  was normal. Systolic function was normal. The estimated ejection fraction was in the range of 60% to 65%. Wall motion was normal; there were no regional wall motion abnormalities.  Recent Labs: 09/11/2015: ALT 26; BUN 15; Creatinine, Ser 1.01; Hemoglobin 15.0; Platelets 194; Potassium 3.9; Sodium 140; TSH 1.259  09/12/2015: Cholesterol 183; HDL 53; LDL Cholesterol 112*; Total CHOL/HDL Ratio 3.5; Triglycerides 91; VLDL 18   CrCl cannot be calculated (Patient has no serum creatinine result on file.).   Wt Readings from Last 3 Encounters:  10/10/15 179 lb (81.194 kg)  09/12/15 171 lb 4.8 oz (77.7 kg)  06/08/14 166 lb (75.297 kg)     Other studies reviewed: Additional studies/records reviewed today include: summarized above  ASSESSMENT AND PLAN:  1. Palpitations Historically his palpitations have been suspect to be AVNRT noting description of "frog-positive" This is the first episode of palpitations of any significant duration, previously being a few seconds and infrequent Discussed with patient, given infrequency  of his palpitations, no EPS for now.  He has PRN verapamil if needed  2. CP Onset is with start of using CPAP, it wakes him at night, he denies any daytime or exertional symptoms CV risk is low, onset correlates with his use of CPAP   Patient is seen with Dr. Caryl Comes, recommend discuss with sleep MD holding CPAP use for trial and eval symptom for resolution of his nocturnal chest pain.  Continue his use of OTC PPI (?name he cannot recall)  If resolves to continue f/u with sleep MD about options for his snoring such as mouth appliance.  If chest discomfort continues, to notify us for further w/u and f/u.  Otherwise will see him in 1 year.  Disposition: as plan above  Current medicines are reviewed at length with the patient today.  The patient did not have any concerns regarding medicines.  Haywood Lasso, PA-C 10/10/2015 9:47 AM     [address] [place] [address] [phone] (office)  [phone] (fax)

## 2015-10-10 ENCOUNTER — Ambulatory Visit (INDEPENDENT_AMBULATORY_CARE_PROVIDER_SITE_OTHER): Payer: BLUE CROSS/BLUE SHIELD | Admitting: Physician Assistant

## 2015-10-10 ENCOUNTER — Encounter: Payer: Self-pay | Admitting: Physician Assistant

## 2015-10-10 VITALS — BP 100/62 | HR 59 | Ht 72.0 in | Wt 179.0 lb

## 2015-10-10 DIAGNOSIS — R002 Palpitations: Secondary | ICD-10-CM | POA: Diagnosis not present

## 2015-10-10 DIAGNOSIS — R079 Chest pain, unspecified: Secondary | ICD-10-CM | POA: Diagnosis not present

## 2015-10-10 NOTE — Patient Instructions (Addendum)
Medication Instructions:   Your physician recommends that you continue on your current medications as directed. Please refer to the Current Medication list given to you today.   If you need a refill on your cardiac medications before your next appointment, please call your pharmacy.  Labwork: NONE ORDER TODAY    Testing/Procedures: NONE ORDER TODAY    Follow-Up: Your physician wants you to follow-up in: [place] will receive a reminder letter in the mail two months in advance. If you don't receive a letter, please call our office to schedule the follow-up appointment.    Any Other Special Instructions Will Be Listed Below (If Applicable).

## 2016-03-27 ENCOUNTER — Ambulatory Visit (INDEPENDENT_AMBULATORY_CARE_PROVIDER_SITE_OTHER): Payer: BLUE CROSS/BLUE SHIELD | Admitting: Ophthalmology

## 2016-03-27 DIAGNOSIS — H26492 Other secondary cataract, left eye: Secondary | ICD-10-CM | POA: Diagnosis not present

## 2016-03-27 DIAGNOSIS — H338 Other retinal detachments: Secondary | ICD-10-CM

## 2016-03-27 DIAGNOSIS — H4423 Degenerative myopia, bilateral: Secondary | ICD-10-CM | POA: Diagnosis not present

## 2016-03-27 DIAGNOSIS — H43813 Vitreous degeneration, bilateral: Secondary | ICD-10-CM | POA: Diagnosis not present

## 2016-03-27 DIAGNOSIS — H33303 Unspecified retinal break, bilateral: Secondary | ICD-10-CM

## 2016-04-07 ENCOUNTER — Ambulatory Visit (INDEPENDENT_AMBULATORY_CARE_PROVIDER_SITE_OTHER): Payer: BLUE CROSS/BLUE SHIELD | Admitting: Ophthalmology

## 2016-04-07 DIAGNOSIS — H2702 Aphakia, left eye: Secondary | ICD-10-CM

## 2016-07-18 ENCOUNTER — Ambulatory Visit
Admission: RE | Admit: 2016-07-18 | Discharge: 2016-07-18 | Disposition: A | Discharge status: Home / Self Care | Payer: BLUE CROSS/BLUE SHIELD | Source: Ambulatory Visit | Attending: Family Medicine | Admitting: Family Medicine

## 2016-07-18 ENCOUNTER — Other Ambulatory Visit: Payer: Self-pay | Admitting: Family Medicine

## 2016-07-18 DIAGNOSIS — M5442 Lumbago with sciatica, left side: Secondary | ICD-10-CM

## 2017-04-07 ENCOUNTER — Ambulatory Visit (INDEPENDENT_AMBULATORY_CARE_PROVIDER_SITE_OTHER): Payer: BLUE CROSS/BLUE SHIELD | Admitting: Ophthalmology

## 2017-05-04 ENCOUNTER — Ambulatory Visit (INDEPENDENT_AMBULATORY_CARE_PROVIDER_SITE_OTHER): Payer: BLUE CROSS/BLUE SHIELD | Admitting: Ophthalmology

## 2017-05-27 ENCOUNTER — Encounter (INDEPENDENT_AMBULATORY_CARE_PROVIDER_SITE_OTHER): Payer: No Typology Code available for payment source | Admitting: Ophthalmology

## 2017-05-27 DIAGNOSIS — H33303 Unspecified retinal break, bilateral: Secondary | ICD-10-CM | POA: Diagnosis not present

## 2017-05-27 DIAGNOSIS — H43813 Vitreous degeneration, bilateral: Secondary | ICD-10-CM

## 2017-05-27 DIAGNOSIS — H4423 Degenerative myopia, bilateral: Secondary | ICD-10-CM

## 2017-05-27 DIAGNOSIS — H338 Other retinal detachments: Secondary | ICD-10-CM

## 2017-07-24 ENCOUNTER — Other Ambulatory Visit: Payer: Self-pay | Admitting: Surgery

## 2017-07-24 DIAGNOSIS — R1032 Left lower quadrant pain: Secondary | ICD-10-CM

## 2017-07-31 ENCOUNTER — Ambulatory Visit
Admission: RE | Admit: 2017-07-31 | Discharge: 2017-07-31 | Disposition: A | Discharge status: Home / Self Care | Payer: PRIVATE HEALTH INSURANCE | Source: Ambulatory Visit | Attending: Surgery | Admitting: Surgery

## 2017-07-31 DIAGNOSIS — R1032 Left lower quadrant pain: Secondary | ICD-10-CM

## 2017-07-31 MED ORDER — IOPAMIDOL (ISOVUE-300) INJECTION 61%
100.0000 mL | Freq: Once | INTRAVENOUS | Status: AC | PRN
  Administered 2017-07-31: 100 mL via INTRAVENOUS

## 2017-08-03 NOTE — Progress Notes (Signed)
Please call the patient and let them know that their scan showed an enlarged prostate that is causing some compression of the bladder.  No sign of recurrent hernia or diverticulitis.  He does not need surgery from [place] and he does not need to start his antibiotics.  Let his PCP know about the results, because he probably needs a Urology work-up.

## 2017-08-18 ENCOUNTER — Encounter: Payer: Self-pay | Admitting: Family

## 2017-10-08 ENCOUNTER — Ambulatory Visit (INDEPENDENT_AMBULATORY_CARE_PROVIDER_SITE_OTHER): Payer: PRIVATE HEALTH INSURANCE | Admitting: Internal Medicine

## 2017-10-08 VITALS — BP 122/78 | HR 64 | Ht 72.0 in | Wt 181.0 lb

## 2017-10-08 DIAGNOSIS — R002 Palpitations: Secondary | ICD-10-CM | POA: Diagnosis not present

## 2017-10-08 NOTE — Patient Instructions (Signed)
Medication Instructions: Your physician recommends that you continue on your current medications as directed. Please refer to the Current Medication list given to you today.  Labwork: None Ordered  Procedures/Testing: None Ordered  Follow-Up: Your physician wants you to follow-up as needed with Dr. Caryl Comes. You will receive a reminder letter in the mail two months in advance. If you don't receive a letter, please call our office to schedule the follow-up appointment.   If you need a refill on your cardiac medications before your next appointment, please call your pharmacy.

## 2017-10-08 NOTE — Progress Notes (Signed)
Patient Care Team: Lois Huxley, PA as PCP - General (Family Medicine)   HPI  Blake Sims is a 56 y.o. male  He had been seen 2014 with complaints of tachypalpitations which were consistent with reentry and prn verapamil was prescribed;  Recurrent event 2016 documented by EMS @ 230 duration 20 min.  Not documented.  ECG  >> sinus tach and PACs  DATE TEST    11/16 Echo    EF 60-65 %         LDL 116   He has been having recurrent paroxysms of tachycardia occurring now every couple of months.  They are typically short-lived he being able to stop them with Valsalva or carotid massage cough.   There associate with some residual chest discomfort.  Apart from this he has no exercise intolerance and exercise vigorously.  He notes no triggers.  They are less clearly frog positive than my prior notes suggests.  Records and Results Reviewed As above    Past Medical History:  Diagnosis Date  . Complication of anesthesia    trouble urinating after eye surgery 06  . Inguinal hernia    bilateral  . Rapid palpitations 02/23/2013   a. Tachypalps, frog positive 2014 - suspected AVNRT but not documented. b. Recurrent palps 08/2015, again ceased before they could be recorded.    Past Surgical History:  Procedure Laterality Date  . INGUINAL HERNIA REPAIR Bilateral 06/08/2014   Procedure: LAPAROSCOPIC BILATERAL INGUINAL HERNIA REPAIR ;  Surgeon: Imogene Burn. Georgette Dover, MD;  Location: [place];  Service: General;  Laterality: Bilateral;  . INSERTION OF MESH Bilateral 06/08/2014   Procedure: INSERTION OF MESH;  Surgeon: Imogene Burn. Georgette Dover, MD;  Location: [place];  Service: General;  Laterality: Bilateral;  . RETINAL DETACHMENT REPAIR W/ SCLERAL BUCKLE LE Right 06    Current Outpatient Medications  Medication Sig Dispense Refill  . acetaminophen (TYLENOL) 325 MG tablet Take 650 mg by mouth every 6 (six) hours as needed for mild pain, moderate pain or headache.    Marland Kitchen CIALIS 10 MG tablet Take  10 mg by mouth daily as needed for erectile dysfunction.   4  . Multiple Vitamin (MULTIVITAMIN WITH MINERALS) TABS tablet Take 1 tablet by mouth daily.    . verapamil (CALAN) 40 MG tablet Take 1 tablet (40 mg total) by mouth daily as needed (rapid palpitations). 10 tablet 0   No current facility-administered medications for this visit.     Allergies  Allergen Reactions  . Penicillins Swelling    Swelling and itching  Has patient had a PCN reaction causing immediate rash, facial/tongue/throat swelling, SOB or lightheadedness with hypotension: No Has patient had a PCN reaction causing severe rash involving mucus membranes or skin necrosis: No Has patient had a PCN reaction that required hospitalization No Has patient had a PCN reaction occurring within the last 10 years: No If all of the above answers are "NO", then may proceed with Cephalosporin use.      Review of Systems negative except from HPI and PMH  Physical Exam BP 122/78   Pulse 64   Ht 6' (1.829 m)   Wt 181 lb (82.1 kg)   BMI 24.55 kg/m  Well developed and nourished in no acute distress HENT normal Neck supple with JVP-flat Carotids brisk and full without bruits Clear Regular rate and rhythm, no murmurs or gallops Abd-soft with active BS without hepatomegaly No Clubbing cyanosis edema Skin-warm and dry A & Oriented  Grossly normal sensory and motor function   ECG for arrhythmia demonstrates sinus rhythm without preexcitation intervals 15/09/41 Otherwise normal  Assessment and  Plan  Tachypalpitations-abrupt onset/offset probably SVT  LDL  Elevated   SVT frequency is increasing.  We have reviewed the prognosis and strategies to stop.  Given the increasing frequency I suspect he will come to need of more definitive therapy.  Brevity precludes the use of as needed medication.  The infrequency makes daily therapy not appealing.  However, as the frequency increases this may become more so.  We have also  discussed catheter ablation with risks and benefits.  At this point he will continue to watch   Elevated LDL calculates to 3.8% 10 yr risk, below threshold for therapy   We spent more than 50% of our >25 min visit in face to face counseling regarding the above  Current medicines are reviewed at length with the patient today .  The patient does not  have concerns regarding medicines.

## 2017-11-23 ENCOUNTER — Telehealth: Payer: Self-pay | Admitting: Internal Medicine

## 2017-11-23 NOTE — Telephone Encounter (Signed)
Pt states when last seen by Dr. Caryl Comes he mentioned pt having an ablation.  Pt has decided to move forward with this.  Advised I would send message to EP nurses and have one of them reach out to him with possible dates and get this set up.  Pt appreciative for call.

## 2017-11-23 NOTE — Telephone Encounter (Signed)
Blake Sims is calling to set up a procedure that Dr. Caryl Comes says he need to have . Please call

## 2017-11-23 NOTE — Telephone Encounter (Signed)
Per Dr. Caryl Comes, refer to Dr. Lovena Le for possible SVT ablation.  Pt information given to scheduling.

## 2017-11-24 NOTE — Telephone Encounter (Signed)
Scheduling made appt with Dr. Lovena Le for 12/04/2017.  No further action at this time.

## 2017-12-03 ENCOUNTER — Ambulatory Visit: Payer: PRIVATE HEALTH INSURANCE | Admitting: Internal Medicine

## 2017-12-04 ENCOUNTER — Ambulatory Visit (INDEPENDENT_AMBULATORY_CARE_PROVIDER_SITE_OTHER): Payer: PRIVATE HEALTH INSURANCE | Admitting: Internal Medicine

## 2017-12-04 ENCOUNTER — Encounter: Payer: Self-pay | Admitting: Internal Medicine

## 2017-12-04 VITALS — BP 110/70 | HR 61 | Ht 72.0 in | Wt 178.0 lb

## 2017-12-04 DIAGNOSIS — I471 Supraventricular tachycardia: Secondary | ICD-10-CM | POA: Diagnosis not present

## 2017-12-04 DIAGNOSIS — R002 Palpitations: Secondary | ICD-10-CM | POA: Diagnosis not present

## 2017-12-04 NOTE — Patient Instructions (Addendum)
Medication Instructions:  Your physician recommends that you continue on your current medications as directed. Please refer to the Current Medication list given to you today.  Labwork: You will come to the [facility] office on December 22, 2017 for lab work:  BMP and CBC.  Testing/Procedures:  Your physician has recommended that you have an ablation. Catheter ablation is a medical procedure used to treat some cardiac arrhythmias (irregular heartbeats). During catheter ablation, a long, thin, flexible tube is put into a blood vessel in your groin (upper thigh), or neck. This tube is called an ablation catheter. It is then guided to your heart through the blood vessel. Radio frequency waves destroy small areas of heart tissue where abnormal heartbeats may cause an arrhythmia to start. Please see the instruction sheet given to you today.  Follow-Up:  You will follow up with Dr. Lovena Le 4 weeks after your procedure.  Any Other Special Instructions Will Be Listed Below (If Applicable).  Please arrive at the [facility] main entrance of [place] hospital at:  8:30 am on December 30, 2017. Do not eat or drink after midnight prior to procedure. Do not take any medications the morning of the procedure.  Do not take your verapamil for 2 days prior to your procedure.  Last dose will be December 27, 2017. Plan for one night stay-but you may be discharged.  You will need someone to drive you home at discharge.  If you need a refill on your cardiac medications before your next appointment, please call your pharmacy.   Cardiac Ablation Cardiac ablation is a procedure to disable (ablate) a small amount of heart tissue in very specific places. The heart has many electrical connections. Sometimes these connections are abnormal and can cause the heart to beat very fast or irregularly. Ablating some of the problem areas can improve the heart rhythm or return it to normal. Ablation may be done for people who:  Have  Wolff-Parkinson-White syndrome.  Have fast heart rhythms (tachycardia).  Have taken medicines for an abnormal heart rhythm (arrhythmia) that were not effective or caused side effects.  Have a high-risk heartbeat that may be life-threatening.  During the procedure, a small incision is made in the neck or the groin, and a long, thin, flexible tube (catheter) is inserted into the incision and moved to the heart. Small devices (electrodes) on the tip of the catheter will send out electrical currents. A type of X-ray (fluoroscopy) will be used to help guide the catheter and to provide images of the heart. Tell a health care provider about:  Any allergies you have.  All medicines you are taking, including vitamins, herbs, eye drops, creams, and over-the-counter medicines.  Any problems you or family members have had with anesthetic medicines.  Any blood disorders you have.  Any surgeries you have had.  Any medical conditions you have, such as kidney failure.  Whether you are pregnant or may be pregnant. What are the risks? Generally, this is a safe procedure. However, problems may occur, including:  Infection.  Bruising and bleeding at the catheter insertion site.  Bleeding into the chest, especially into the sac that surrounds the heart. This is a serious complication.  Stroke or blood clots.  Damage to other structures or organs.  Allergic reaction to medicines or dyes.  Need for a permanent pacemaker if the normal electrical system is damaged. A pacemaker is a small computer that sends electrical signals to the heart and helps your heart beat normally.  The procedure not being fully effective. This may not be recognized until months later. Repeat ablation procedures are sometimes required.  What happens before the procedure?  Follow instructions from your health care provider about eating or drinking restrictions.  Ask your health care provider about: ? Changing or  stopping your regular medicines. This is especially important if you are taking diabetes medicines or blood thinners. ? Taking medicines such as aspirin and ibuprofen. These medicines can thin your blood. Do not take these medicines before your procedure if your health care provider instructs you not to.  Plan to have someone take you home from the hospital or clinic.  If you will be going home right after the procedure, plan to have someone with you for 24 hours. What happens during the procedure?  To lower your risk of infection: ? Your health care team will wash or sanitize their hands. ? Your skin will be washed with soap. ? Hair may be removed from the incision area.  An IV tube will be inserted into one of your veins.  You will be given a medicine to help you relax (sedative).  The skin on your neck or groin will be numbed.  An incision will be made in your neck or your groin.  A needle will be inserted through the incision and into a large vein in your neck or groin.  A catheter will be inserted into the needle and moved to your heart.  Dye may be injected through the catheter to help your surgeon see the area of the heart that needs treatment.  Electrical currents will be sent from the catheter to ablate heart tissue in desired areas. There are three types of energy that may be used to ablate heart tissue: ? Heat (radiofrequency energy). ? Laser energy. ? Extreme cold (cryoablation).  When the necessary tissue has been ablated, the catheter will be removed.  Pressure will be held on the catheter insertion area to prevent excessive bleeding.  A bandage (dressing) will be placed over the catheter insertion area. The procedure may vary among health care providers and hospitals. What happens after the procedure?  Your blood pressure, heart rate, breathing rate, and blood oxygen level will be monitored until the medicines you were given have worn off.  Your catheter  insertion area will be monitored for bleeding. You will need to lie still for a few hours to ensure that you do not bleed from the catheter insertion area.  Do not drive for 24 hours or as long as directed by your health care provider. Summary  Cardiac ablation is a procedure to disable (ablate) a small amount of heart tissue in very specific places. Ablating some of the problem areas can improve the heart rhythm or return it to normal.  During the procedure, electrical currents will be sent from the catheter to ablate heart tissue in desired areas. This information is not intended to replace advice given to you by your health care provider. Make sure you discuss any questions you have with your health care provider. Document Released: 02/15/2009 Document Revised: 08/18/2016 Document Reviewed: 08/18/2016 Elsevier Interactive Patient Education  [employer].

## 2017-12-04 NOTE — H&P (View-Only) (Signed)
HPI [name] is referred today by Dr. Caryl Comes to consider SVT ablation. He is a plelasant 57 yo man with know SVT at rates of upto 230/min. These episodes stop and start suddenly. He has associated sob and chest pressure with his symptoms which last for upto an hour. He has never had syncope. He has preserved LV function. He has been treated with verapamil.  Allergies  Allergen Reactions  . Penicillins Swelling    Swelling and itching  Has patient had a PCN reaction causing immediate rash, facial/tongue/throat swelling, SOB or lightheadedness with hypotension: No Has patient had a PCN reaction causing severe rash involving mucus membranes or skin necrosis: No Has patient had a PCN reaction that required hospitalization No Has patient had a PCN reaction occurring within the last 10 years: No If all of the above answers are "NO", then may proceed with Cephalosporin use.     Current Outpatient Medications  Medication Sig Dispense Refill  . acetaminophen (TYLENOL) 325 MG tablet Take 650 mg by mouth every 6 (six) hours as needed for mild pain, moderate pain or headache.    Marland Kitchen CIALIS 10 MG tablet Take 10 mg by mouth daily as needed for erectile dysfunction.   4  . Multiple Vitamin (MULTIVITAMIN WITH MINERALS) TABS tablet Take 1 tablet by mouth daily.    . verapamil (CALAN) 40 MG tablet Take 1 tablet (40 mg total) by mouth daily as needed (rapid palpitations). 10 tablet 0   No current facility-administered medications for this visit.      Past Medical History:  Diagnosis Date  . Complication of anesthesia    trouble urinating after eye surgery 06  . Inguinal hernia    bilateral  . Rapid palpitations 02/23/2013   a. Tachypalps, frog positive 2014 - suspected AVNRT but not documented. b. Recurrent palps 08/2015, again ceased before they could be recorded.    ROS:   All systems reviewed and negative except as noted in the HPI.   Past Surgical History:  Procedure Laterality  Date  . INGUINAL HERNIA REPAIR Bilateral 06/08/2014   Procedure: LAPAROSCOPIC BILATERAL INGUINAL HERNIA REPAIR ;  Surgeon: Imogene Burn. Georgette Dover, MD;  Location: [place];  Service: General;  Laterality: Bilateral;  . INSERTION OF MESH Bilateral 06/08/2014   Procedure: INSERTION OF MESH;  Surgeon: Imogene Burn. Georgette Dover, MD;  Location: [place];  Service: General;  Laterality: Bilateral;  . RETINAL DETACHMENT REPAIR W/ SCLERAL BUCKLE LE Right 06     Family History  Problem Relation Age of Onset  . CAD Neg Hx        neg hx     Social History   Socioeconomic History  . Marital status: Married    Spouse name: Not on file  . Number of children: Not on file  . Years of education: Not on file  . Highest education level: Not on file  Social Needs  . Financial resource strain: Not on file  . Food insecurity - worry: Not on file  . Food insecurity - inability: Not on file  . Transportation needs - medical: Not on file  . Transportation needs - non-medical: Not on file  Occupational History  . Not on file  Tobacco Use  . Smoking status: Never Smoker  Substance and Sexual Activity  . Alcohol use: No  . Drug use: No  . Sexual activity: Not on file  Other Topics Concern  . Not on file  Social History Narrative  . Not  on file     BP 110/70   Pulse 61   Ht 6' (1.829 m)   Wt 178 lb (80.7 kg)   BMI 24.14 kg/m   Physical Exam:  Well appearing NAD HEENT: Unremarkable Neck:  No JVD, no thyromegally Lymphatics:  No adenopathy Back:  No CVA tenderness Lungs:  Clear HEART:  Regular rate rhythm, no murmurs, no rubs, no clicks Abd:  soft, positive bowel sounds, no organomegally, no rebound, no guarding Ext:  2 plus pulses, no edema, no cyanosis, no clubbing Skin:  No rashes no nodules Neuro:  CN II through XII intact, motor grossly intact  EKG - nsr with no pre-excitation   Assess/Plan: 1. SVT - I have discussed his treatment options and recommended catheter ablation. I have reviewed the  risks/benefits/goals/expectations of EP study and catheter ablation and he will call us if he wishes to proceed.   Mikle Bosworth.D.

## 2017-12-04 NOTE — Progress Notes (Signed)
HPI [name] is referred today by Dr. Caryl Comes to consider SVT ablation. He is a plelasant 57 yo man with know SVT at rates of upto 230/min. These episodes stop and start suddenly. He has associated sob and chest pressure with his symptoms which last for upto an hour. He has never had syncope. He has preserved LV function. He has been treated with verapamil.  Allergies  Allergen Reactions  . Penicillins Swelling    Swelling and itching  Has patient had a PCN reaction causing immediate rash, facial/tongue/throat swelling, SOB or lightheadedness with hypotension: No Has patient had a PCN reaction causing severe rash involving mucus membranes or skin necrosis: No Has patient had a PCN reaction that required hospitalization No Has patient had a PCN reaction occurring within the last 10 years: No If all of the above answers are "NO", then may proceed with Cephalosporin use.     Current Outpatient Medications  Medication Sig Dispense Refill  . acetaminophen (TYLENOL) 325 MG tablet Take 650 mg by mouth every 6 (six) hours as needed for mild pain, moderate pain or headache.    Marland Kitchen CIALIS 10 MG tablet Take 10 mg by mouth daily as needed for erectile dysfunction.   4  . Multiple Vitamin (MULTIVITAMIN WITH MINERALS) TABS tablet Take 1 tablet by mouth daily.    . verapamil (CALAN) 40 MG tablet Take 1 tablet (40 mg total) by mouth daily as needed (rapid palpitations). 10 tablet 0   No current facility-administered medications for this visit.      Past Medical History:  Diagnosis Date  . Complication of anesthesia    trouble urinating after eye surgery 06  . Inguinal hernia    bilateral  . Rapid palpitations 02/23/2013   a. Tachypalps, frog positive 2014 - suspected AVNRT but not documented. b. Recurrent palps 08/2015, again ceased before they could be recorded.    ROS:   All systems reviewed and negative except as noted in the HPI.   Past Surgical History:  Procedure Laterality  Date  . INGUINAL HERNIA REPAIR Bilateral 06/08/2014   Procedure: LAPAROSCOPIC BILATERAL INGUINAL HERNIA REPAIR ;  Surgeon: Imogene Burn. Georgette Dover, MD;  Location: [place];  Service: General;  Laterality: Bilateral;  . INSERTION OF MESH Bilateral 06/08/2014   Procedure: INSERTION OF MESH;  Surgeon: Imogene Burn. Georgette Dover, MD;  Location: [place];  Service: General;  Laterality: Bilateral;  . RETINAL DETACHMENT REPAIR W/ SCLERAL BUCKLE LE Right 06     Family History  Problem Relation Age of Onset  . CAD Neg Hx        neg hx     Social History   Socioeconomic History  . Marital status: Married    Spouse name: Not on file  . Number of children: Not on file  . Years of education: Not on file  . Highest education level: Not on file  Social Needs  . Financial resource strain: Not on file  . Food insecurity - worry: Not on file  . Food insecurity - inability: Not on file  . Transportation needs - medical: Not on file  . Transportation needs - non-medical: Not on file  Occupational History  . Not on file  Tobacco Use  . Smoking status: Never Smoker  Substance and Sexual Activity  . Alcohol use: No  . Drug use: No  . Sexual activity: Not on file  Other Topics Concern  . Not on file  Social History Narrative  . Not  on file     BP 110/70   Pulse 61   Ht 6' (1.829 m)   Wt 178 lb (80.7 kg)   BMI 24.14 kg/m   Physical Exam:  Well appearing NAD HEENT: Unremarkable Neck:  No JVD, no thyromegally Lymphatics:  No adenopathy Back:  No CVA tenderness Lungs:  Clear HEART:  Regular rate rhythm, no murmurs, no rubs, no clicks Abd:  soft, positive bowel sounds, no organomegally, no rebound, no guarding Ext:  2 plus pulses, no edema, no cyanosis, no clubbing Skin:  No rashes no nodules Neuro:  CN II through XII intact, motor grossly intact  EKG - nsr with no pre-excitation   Assess/Plan: 1. SVT - I have discussed his treatment options and recommended catheter ablation. I have reviewed the  risks/benefits/goals/expectations of EP study and catheter ablation and he will call us if he wishes to proceed.   Mikle Bosworth.D.

## 2017-12-07 ENCOUNTER — Telehealth: Payer: Self-pay | Admitting: Internal Medicine

## 2017-12-07 NOTE — Telephone Encounter (Signed)
Patient calling to r/s cath lab at an earlier date

## 2017-12-09 NOTE — Telephone Encounter (Signed)
Returned Pt call.  Pt requesting to move ablation to March 4.  Rescheduled Pt for 2:30 pm.  Pt will get labs tomorrow 12/10/2017.  Will include instruction sheet for pick up.

## 2017-12-10 ENCOUNTER — Other Ambulatory Visit: Payer: PRIVATE HEALTH INSURANCE | Admitting: *Deleted

## 2017-12-10 DIAGNOSIS — I471 Supraventricular tachycardia: Secondary | ICD-10-CM

## 2017-12-10 DIAGNOSIS — R002 Palpitations: Secondary | ICD-10-CM

## 2017-12-11 LAB — CBC WITH DIFFERENTIAL/PLATELET
BASOS ABS: 0 10*3/uL (ref 0.0–0.2)
Basos: 1 %
EOS (ABSOLUTE): 0.3 10*3/uL (ref 0.0–0.4)
Eos: 6 %
Hematocrit: 42.2 % (ref 37.5–51.0)
Hemoglobin: 14.5 g/dL (ref 13.0–17.7)
Immature Grans (Abs): 0 10*3/uL (ref 0.0–0.1)
Immature Granulocytes: 0 %
LYMPHS ABS: 2 10*3/uL (ref 0.7–3.1)
Lymphs: 35 %
MCH: 31.2 pg (ref 26.6–33.0)
MCHC: 34.4 g/dL (ref 31.5–35.7)
MCV: 91 fL (ref 79–97)
MONOS ABS: 0.6 10*3/uL (ref 0.1–0.9)
Monocytes: 11 %
Neutrophils Absolute: 2.7 10*3/uL (ref 1.4–7.0)
Neutrophils: 47 %
Platelets: 201 10*3/uL (ref 150–379)
RBC: 4.65 x10E6/uL (ref 4.14–5.80)
RDW: 13.2 % (ref 12.3–15.4)
WBC: 5.6 10*3/uL (ref 3.4–10.8)

## 2017-12-11 LAB — BASIC METABOLIC PANEL
BUN / CREAT RATIO: 15 (ref 9–20)
BUN: 13 mg/dL (ref 6–24)
CHLORIDE: 104 mmol/L (ref 96–106)
CO2: 24 mmol/L (ref 20–29)
Calcium: 9.2 mg/dL (ref 8.7–10.2)
Creatinine, Ser: 0.87 mg/dL (ref 0.76–1.27)
GFR calc non Af Amer: 96 mL/min/{1.73_m2} (ref >59)
GFR, EST AFRICAN AMERICAN: 111 mL/min/{1.73_m2} (ref >59)
Glucose: 82 mg/dL (ref 65–99)
POTASSIUM: 4.2 mmol/L (ref 3.5–5.2)
SODIUM: 142 mmol/L (ref 134–144)

## 2017-12-14 ENCOUNTER — Encounter (HOSPITAL_COMMUNITY)
Admission: RE | Disposition: A | Discharge status: Home / Self Care | Payer: Self-pay | Source: Ambulatory Visit | Attending: Internal Medicine

## 2017-12-14 ENCOUNTER — Ambulatory Visit (HOSPITAL_COMMUNITY)
Admission: RE | Admit: 2017-12-14 | Discharge: 2017-12-15 | Disposition: A | Discharge status: Home / Self Care | Payer: PRIVATE HEALTH INSURANCE | Source: Ambulatory Visit | Attending: Internal Medicine | Admitting: Internal Medicine

## 2017-12-14 DIAGNOSIS — Z880 Allergy status to penicillin: Secondary | ICD-10-CM | POA: Diagnosis not present

## 2017-12-14 DIAGNOSIS — I471 Supraventricular tachycardia, unspecified: Secondary | ICD-10-CM

## 2017-12-14 HISTORY — DX: Supraventricular tachycardia, unspecified: I47.10

## 2017-12-14 HISTORY — PX: SVT ABLATION: EP1225

## 2017-12-14 HISTORY — DX: Supraventricular tachycardia: I47.1

## 2017-12-14 LAB — POCT ACTIVATED CLOTTING TIME: Activated Clotting Time: 169 seconds

## 2017-12-14 SURGERY — SVT ABLATION

## 2017-12-14 MED ORDER — ACETAMINOPHEN 325 MG PO TABS: 650.0000 mg | ORAL_TABLET | ORAL | Status: DC | PRN

## 2017-12-14 MED ORDER — SODIUM CHLORIDE 0.9% FLUSH: 3.0000 mL | INTRAVENOUS | Status: DC | PRN

## 2017-12-14 MED ORDER — MIDAZOLAM HCL 5 MG/5ML IJ SOLN
INTRAMUSCULAR | Status: DC | PRN
  Administered 2017-12-14 (×2): 2 mg via INTRAVENOUS
  Administered 2017-12-14: 1 mg via INTRAVENOUS
  Administered 2017-12-14 (×2): 2 mg via INTRAVENOUS
  Administered 2017-12-14 (×3): 1 mg via INTRAVENOUS

## 2017-12-14 MED ORDER — BUPIVACAINE HCL (PF) 0.25 % IJ SOLN
INTRAMUSCULAR | Status: DC | PRN
  Administered 2017-12-14: 50 mL

## 2017-12-14 MED ORDER — ONDANSETRON HCL 4 MG/2ML IJ SOLN: 4.0000 mg | Freq: Four times a day (QID) | INTRAMUSCULAR | Status: DC | PRN

## 2017-12-14 MED ORDER — MIDAZOLAM HCL 5 MG/5ML IJ SOLN
INTRAMUSCULAR | Status: AC
  Filled 2017-12-14: qty 5

## 2017-12-14 MED ORDER — HEPARIN (PORCINE) IN NACL 2-0.9 UNIT/ML-% IJ SOLN
INTRAMUSCULAR | Status: AC
  Filled 2017-12-14: qty 500

## 2017-12-14 MED ORDER — BUPIVACAINE HCL (PF) 0.25 % IJ SOLN
INTRAMUSCULAR | Status: AC
  Filled 2017-12-14: qty 60

## 2017-12-14 MED ORDER — FENTANYL CITRATE (PF) 100 MCG/2ML IJ SOLN
INTRAMUSCULAR | Status: AC
  Filled 2017-12-14: qty 2

## 2017-12-14 MED ORDER — HEPARIN SODIUM (PORCINE) 1000 UNIT/ML IJ SOLN
INTRAMUSCULAR | Status: DC | PRN
  Administered 2017-12-14: 6000 [IU] via INTRAVENOUS

## 2017-12-14 MED ORDER — SODIUM CHLORIDE 0.9 % IV SOLN
INTRAVENOUS | Status: DC
  Administered 2017-12-14: 13:00:00 via INTRAVENOUS

## 2017-12-14 MED ORDER — SODIUM CHLORIDE 0.9% FLUSH
3.0000 mL | Freq: Two times a day (BID) | INTRAVENOUS | Status: DC
  Administered 2017-12-14: 3 mL via INTRAVENOUS

## 2017-12-14 MED ORDER — FENTANYL CITRATE (PF) 100 MCG/2ML IJ SOLN
INTRAMUSCULAR | Status: DC | PRN
  Administered 2017-12-14: 12.5 ug via INTRAVENOUS
  Administered 2017-12-14 (×2): 25 ug via INTRAVENOUS
  Administered 2017-12-14: 12.5 ug via INTRAVENOUS
  Administered 2017-12-14 (×3): 25 ug via INTRAVENOUS

## 2017-12-14 MED ORDER — HEPARIN (PORCINE) IN NACL 2-0.9 UNIT/ML-% IJ SOLN
INTRAMUSCULAR | Status: AC | PRN
  Administered 2017-12-14 (×2): 500 mL

## 2017-12-14 MED ORDER — SODIUM CHLORIDE 0.9 % IV SOLN: 250.0000 mL | INTRAVENOUS | Status: DC | PRN

## 2017-12-14 SURGICAL SUPPLY — 13 items
BAG SNAP BAND KOVER 36X36 (MISCELLANEOUS) ×1 IMPLANT
BLANKET WARM UNDERBOD FULL ACC (MISCELLANEOUS) ×1 IMPLANT
CATH CELSIUS THERM B CV 7F (ABLATOR) ×1 IMPLANT
CATH CELSIUS THERM D CV 7F (ABLATOR) ×1 IMPLANT
CATH HEX JOSEPH 2-5-2 65CM 6F (CATHETERS) ×1 IMPLANT
CATH JOSEPHSON QUAD-ALLRED 6FR (CATHETERS) ×2 IMPLANT
PACK EP LATEX FREE (CUSTOM PROCEDURE TRAY) ×2
PACK EP LF (CUSTOM PROCEDURE TRAY) IMPLANT
PAD DEFIB LIFELINK (PAD) ×1 IMPLANT
SHEATH PINNACLE 6F 10CM (SHEATH) ×2 IMPLANT
SHEATH PINNACLE 7F 10CM (SHEATH) ×1 IMPLANT
SHEATH PINNACLE 8F 10CM (SHEATH) ×2 IMPLANT
SHIELD RADPAD SCOOP 12X17 (MISCELLANEOUS) ×1 IMPLANT

## 2017-12-14 NOTE — Progress Notes (Addendum)
Site area: RFA x 1 RFV x 3 Site Prior to Removal:  Level 0 Pressure Applied For:30 min Manual:   yes Patient Status During Pull:  stable Post Pull Site:  Level 0 Post Pull Instructions Given:  yes Post Pull Pulses Present: palpale Dressing Applied:  tegaderm Bedrest begins @ 7972 till 2320 Comments:

## 2017-12-14 NOTE — Discharge Summary (Addendum)
ELECTROPHYSIOLOGY PROCEDURE DISCHARGE SUMMARY    Patient ID: Blake Sims,  MRN: [id number], DOB/AGE: 1961/09/16 57 y.o.  Admit date: 12/14/2017 Discharge date: 12/15/17  Primary Care Physician: Lois Huxley, PA  Electrophysiologist: Dr. Caryl Comes  Primary Discharge Diagnosis:  1. SVT  Secondary Discharge Diagnosis:  1. none  Allergies  Allergen Reactions  . Penicillins Itching, Swelling and Other (See Comments)    Has patient had a PCN reaction causing immediate rash, facial/tongue/throat swelling, SOB or lightheadedness with hypotension: No Has patient had a PCN reaction causing severe rash involving mucus membranes or skin necrosis: No Has patient had a PCN reaction that required hospitalization No Has patient had a PCN reaction occurring within the last 10 years: No If all of the above answers are "NO", then may proceed with Cephalosporin use.     Procedures This Admission: 1.  Electrophysiology study and radiofrequency catheter ablation on 12/14/17 by Dr Lovena Le.   This study demonstrated  Conclusion: Successful EP study and catheter ablation of a concealed left posterior accessory pathway resulting in inducible AV reentrant tachycardia.  Successful ablation was carried out on the ventricular insertion of the pathway rendering the pathway and SVT non-inducible and nonfunctional There were no early apparent complications  Brief HPI: Blake Sims is a 57 y.o. male with a past medical history as outlined above.He has had increasing tachypalpitations with documented SVT.   Risks, benefits, and alternatives to ablation were reviewed with the patient who wished to proceed.   Hospital Course:  The patient was admitted and underwent EPS/RFCA with details as outlined above. He was monitored on telemetry overnight which demonstrated SR.  R Groin and R  IJ sites were without complication.  The patient was examined by Dr. Lovena Le and considered stable for discharge to home.   Follow has been arranged in 4 weeks.  Wound care and restrictions were reviewed with the patient prior to discharge.   Physical Exam: Vitals:   12/14/17 2100 12/14/17 2200 12/15/17 0714 12/15/17 0715  BP: 107/64 108/65 106/65   Pulse:   69   Resp: (!) 21 13 18 18   Temp:   98.1 F (36.7 C)   TempSrc:   Oral   SpO2:   93%   Weight:   180 lb (81.6 kg)   Height:        GEN- The patient is well appearing, alert and oriented x 3 today.   HEENT: normocephalic, atraumatic; sclera clear, conjunctiva pink; hearing intact; neck supple, no JVP, R IJ site is stable, no bleeding or hematoma Lymph- no cervical lymphadenopathy Lungs- CTA b/l, normal work of breathing.  No wheezes, rales, rhonchi Heart- RRR, no murmurs, rubs or gallops, PMI not laterally displaced GI- soft, non-tender, non-distended, bowel sounds present, no hepatosplenomegaly Extremities- no clubbing, cyanosis, or edema;  DP/PT 2+ bilaterally,  R groin without hematoma/bruit MS- no significant deformity or atrophy Skin- warm and dry, no rash or lesion Psych- euthymic mood, full affect Neuro- strength and sensation are intact   Discharge Vitals: *BP 106/65   Pulse 69   Temp 98.1 F (36.7 C) (Oral)   Resp 18   Ht 6' (1.829 m)   Wt 180 lb (81.6 kg)   SpO2 93%   BMI 24.41 kg/m    Labs:   Lab Results  Component Value Date   WBC 5.6 12/10/2017   HGB 14.5 12/10/2017   HCT 42.2 12/10/2017   MCV 91 12/10/2017   PLT 201 12/10/2017  Recent Labs  Lab 12/10/17 1154  NA 142  K 4.2  CL 104  CO2 24  BUN 13  CREATININE 0.87  CALCIUM 9.2  GLUCOSE 82    Discharge Medications:  Allergies as of 12/15/2017      Reactions   Penicillins Itching, Swelling, Other (See Comments)   Has patient had a PCN reaction causing immediate rash, facial/tongue/throat swelling, SOB or lightheadedness with hypotension: No Has patient had a PCN reaction causing severe rash involving mucus membranes or skin necrosis: No Has patient had a  PCN reaction that required hospitalization No Has patient had a PCN reaction occurring within the last 10 years: No If all of the above answers are "NO", then may proceed with Cephalosporin use.      Medication List    STOP taking these medications   verapamil 40 MG tablet Commonly known as:  CALAN     TAKE these medications   multivitamin with minerals Tabs tablet Take 1 tablet by mouth daily.   tamsulosin 0.4 MG Caps capsule Commonly known as:  FLOMAX Take 0.4 mg by mouth daily.       Disposition:  Home   Follow-up Information    Evans Lance, MD Follow up on 01/12/2018.   Specialty:  Cardiology Why:  4:15PM Contact information: [address] [address] [phone]           Duration of Discharge Encounter: Greater than 30 minutes including physician time.  Venetia Night, PA-C 12/15/2017 8:32 AM  EP Attending  Patient seen and examined. Agree with the findings as noted above. The patient is s/p EP study and ablation of AVRT. He is doing well. He will be discharged home. Usual followup.  Mikle Bosworth.D.

## 2017-12-14 NOTE — Discharge Instructions (Signed)
Post procedure care instructions No driving for 4 days. No lifting over 5 lbs for 1 week. No vigorous or sexual activity for 1 week. You may return to work on 12/21/17. Keep procedure site clean & dry. If you notice increased pain, swelling, bleeding or pus, call/return!  You may shower, but no soaking baths/hot tubs/pools for 1 week.

## 2017-12-14 NOTE — Progress Notes (Signed)
Patient arrived from cath lab on stretcher at [facility]. Reported feeling a shooting pain from right groin site down his leg to shin, rated 10/10. Chip Ouida Sills reported that patients heart rate dropped to 50's, patient complained of feeling nauseated, color pale. 1810 Patient reported that nausea resolved. Monitor shows SR 60's. Color pink. Patient reported that pain resolving 7/10, right groin site radiating approx 6 inches down leg.  1830 stated pain resolving, heart rate in 70's, color pink and nausea has not returned.  1900 Feeling better 1930 No pain at this time. Color pink. Groin site unchanged, soft with no bleeding or hematoma, dressing dry and intact.

## 2017-12-14 NOTE — Interval H&P Note (Signed)
History and Physical Interval Note:  12/14/2017 2:28 PM  Juanda Bond Brau  has presented today for surgery, with the diagnosis of SVT  The various methods of treatment have been discussed with the patient and family. After consideration of risks, benefits and other options for treatment, the patient has consented to  Procedure(s): SVT ABLATION (N/A) as a surgical intervention .  The patient's history has been reviewed, patient examined, no change in status, stable for surgery.  I have reviewed the patient's chart and labs.  Questions were answered to the patient's satisfaction.     Cristopher Peru

## 2017-12-15 ENCOUNTER — Encounter (HOSPITAL_COMMUNITY): Payer: Self-pay | Admitting: Internal Medicine

## 2017-12-15 ENCOUNTER — Other Ambulatory Visit: Payer: Self-pay

## 2017-12-15 DIAGNOSIS — I471 Supraventricular tachycardia: Secondary | ICD-10-CM | POA: Diagnosis not present

## 2017-12-15 MED ORDER — OFF THE BEAT BOOK
Freq: Once | Status: AC
  Administered 2017-12-15: 07:00:00
  Filled 2017-12-15: qty 1

## 2017-12-22 ENCOUNTER — Other Ambulatory Visit: Payer: PRIVATE HEALTH INSURANCE

## 2017-12-23 ENCOUNTER — Encounter: Payer: Self-pay | Admitting: Internal Medicine

## 2017-12-23 NOTE — Telephone Encounter (Signed)
Spoke with pt and he denies pleuritic chest pain, or pain relieved when bending over. Per Dr Rayann Heman, this is a normal soreness and advised pt take 400mg  of over the counter Ibuprofen every 6 hrs to alleviate his soreness. Pt verbalized understanding and had no additional questions.

## 2018-01-01 ENCOUNTER — Encounter: Payer: Self-pay | Admitting: Internal Medicine

## 2018-01-06 ENCOUNTER — Ambulatory Visit: Payer: PRIVATE HEALTH INSURANCE | Admitting: Internal Medicine

## 2018-01-12 ENCOUNTER — Ambulatory Visit (INDEPENDENT_AMBULATORY_CARE_PROVIDER_SITE_OTHER): Payer: PRIVATE HEALTH INSURANCE | Admitting: Internal Medicine

## 2018-01-12 ENCOUNTER — Encounter: Payer: Self-pay | Admitting: Internal Medicine

## 2018-01-12 VITALS — BP 124/64 | HR 54 | Ht 72.0 in | Wt 182.0 lb

## 2018-01-12 DIAGNOSIS — I471 Supraventricular tachycardia: Secondary | ICD-10-CM | POA: Diagnosis not present

## 2018-01-12 DIAGNOSIS — R002 Palpitations: Secondary | ICD-10-CM | POA: Diagnosis not present

## 2018-01-12 NOTE — Progress Notes (Addendum)
HPI Blake Sims returns today after undergoing EP study and catheter ablation several weeks ago. He was found to have inducible AVRT and a left posterior AP was successfully ablated. In the interim, he has done well with no chest pain or sob. No heart racing. He denies palpitations. He has non-exertional chest pain. Allergies  Allergen Reactions  . Penicillins Itching, Swelling and Other (See Comments)    Has patient had a PCN reaction causing immediate rash, facial/tongue/throat swelling, SOB or lightheadedness with hypotension: No Has patient had a PCN reaction causing severe rash involving mucus membranes or skin necrosis: No Has patient had a PCN reaction that required hospitalization No Has patient had a PCN reaction occurring within the last 10 years: No If all of the above answers are "NO", then may proceed with Cephalosporin use.     Current Outpatient Medications  Medication Sig Dispense Refill  . Multiple Vitamin (MULTIVITAMIN WITH MINERALS) TABS tablet Take 1 tablet by mouth daily.    . tamsulosin (FLOMAX) 0.4 MG CAPS capsule Take 0.4 mg by mouth daily.     No current facility-administered medications for this visit.      Past Medical History:  Diagnosis Date  . Complication of anesthesia    trouble urinating after eye surgery 06  . Inguinal hernia    bilateral  . Rapid palpitations 02/23/2013   a. Tachypalps, frog positive 2014 - suspected AVNRT but not documented. b. Recurrent palps 08/2015, again ceased before they could be recorded.    ROS:   All systems reviewed and negative except as noted in the HPI.   Past Surgical History:  Procedure Laterality Date  . INGUINAL HERNIA REPAIR Bilateral 06/08/2014   Procedure: LAPAROSCOPIC BILATERAL INGUINAL HERNIA REPAIR ;  Surgeon: Imogene Burn. Georgette Dover, MD;  Location: [place];  Service: General;  Laterality: Bilateral;  . INSERTION OF MESH Bilateral 06/08/2014   Procedure: INSERTION OF MESH;  Surgeon: Imogene Burn. Georgette Dover,  MD;  Location: [facility];  Service: General;  Laterality: Bilateral;  . RETINAL DETACHMENT REPAIR W/ SCLERAL BUCKLE LE Right 06  . SVT ABLATION N/A 12/14/2017   Procedure: SVT ABLATION;  Surgeon: Evans Lance, MD;  Location: [facility];  Service: Cardiovascular;  Laterality: N/A;     Family History  Problem Relation Age of Onset  . CAD Neg Hx        neg hx     Social History   Socioeconomic History  . Marital status: Married    Spouse name: Not on file  . Number of children: Not on file  . Years of education: Not on file  . Highest education level: Not on file  Occupational History  . Not on file  Social Needs  . Financial resource strain: Not hard at all  . Food insecurity:    Worry: Patient refused    Inability: Patient refused  . Transportation needs:    Medical: Patient refused    Non-medical: Patient refused  Tobacco Use  . Smoking status: Never Smoker  . Smokeless tobacco: Never Used  Substance and Sexual Activity  . Alcohol use: No  . Drug use: No  . Sexual activity: Not on file  Lifestyle  . Physical activity:    Days per week: Not on file    Minutes per session: Not on file  . Stress: Not on file  Relationships  . Social connections:    Talks on phone: Not on file    Gets together:  Not on file    Attends religious service: Not on file    Active member of club or organization: Not on file    Attends meetings of clubs or organizations: Not on file    Relationship status: Not on file  . Intimate partner violence:    Fear of current or ex partner: Not on file    Emotionally abused: Not on file    Physically abused: Not on file    Forced sexual activity: Not on file  Other Topics Concern  . Not on file  Social History Narrative  . Not on file     BP 124/64   Pulse (!) 54   Ht 6' (1.829 m)   Wt 182 lb (82.6 kg)   BMI 24.68 kg/m   Physical Exam:  Well appearing middle aged man, NAD HEENT: Unremarkable Neck:  6 cm JVD, no  thyromegally Lymphatics:  No adenopathy Back:  No CVA tenderness Lungs:  Clear with no wheezes HEART:  Regular rate rhythm, no murmurs, no rubs, no clicks Abd:  soft, positive bowel sounds, no organomegally, no rebound, no guarding Ext:  2 plus pulses, no edema, no cyanosis, no clubbing Skin:  No rashes no nodules Neuro:  CN II through XII intact, motor grossly intact  EKG - NSR with no pre-excitation   Assess/Plan: 1. SVT - he has undergone successful EP study and catheter ablation of a concealed left posterior AP. He has had no recurrent SVT. He will continue his current meds and undergo watchful waiting. 2. ED - he will continue cialis. 3. Sleep apnea - he will continue CPAP  Blake Sims.D.

## 2018-01-12 NOTE — Patient Instructions (Signed)
Medication Instructions:  Your physician recommends that you continue on your current medications as directed. Please refer to the Current Medication list given to you today.  Labwork: None ordered.  Testing/Procedures: None ordered.  Follow-Up: Your physician wants you to follow-up in: as needed with Dr. Taylor.      Any Other Special Instructions Will Be Listed Below (If Applicable).  If you need a refill on your cardiac medications before your next appointment, please call your pharmacy.   

## 2018-01-27 ENCOUNTER — Ambulatory Visit: Payer: PRIVATE HEALTH INSURANCE | Admitting: Internal Medicine

## 2018-06-08 ENCOUNTER — Encounter: Payer: Self-pay | Admitting: Family

## 2018-06-08 ENCOUNTER — Ambulatory Visit: Payer: PRIVATE HEALTH INSURANCE | Admitting: Family

## 2018-06-08 VITALS — BP 114/74 | HR 67 | Temp 97.7°F | Ht 72.0 in | Wt 180.0 lb

## 2018-06-08 DIAGNOSIS — Z0000 Encounter for general adult medical examination without abnormal findings: Secondary | ICD-10-CM

## 2018-06-08 MED ORDER — TAMSULOSIN HCL 0.4 MG PO CAPS: 0.4000 mg | ORAL_CAPSULE | Freq: Every day | ORAL | 3 refills | Status: DC

## 2018-06-08 MED ORDER — TADALAFIL 10 MG PO TABS: 10.0000 mg | ORAL_TABLET | Freq: Every day | ORAL | 3 refills | Status: DC | PRN

## 2018-06-08 NOTE — Progress Notes (Signed)
Blake Sims is a 57 y.o. male with the following history as recorded in EpicCare:  Patient Active Problem List   Diagnosis Date Noted  . SVT (supraventricular tachycardia) ([place]) 12/14/2017  . OSA (obstructive sleep apnea) 09/12/2015  . Chest pain 09/11/2015  . Bilateral inguinal hernia without obstruction or gangrene 04/25/2014  . Rapid palpitations 02/23/2013    Current Outpatient Medications  Medication Sig Dispense Refill  . Multiple Vitamin (MULTIVITAMIN WITH MINERALS) TABS tablet Take 1 tablet by mouth daily.    . tadalafil (CIALIS) 10 MG tablet Take 1 tablet (10 mg total) by mouth daily as needed for erectile dysfunction. 90 tablet 3  . tamsulosin (FLOMAX) 0.4 MG CAPS capsule Take 1 capsule (0.4 mg total) by mouth daily. 90 capsule 3   No current facility-administered medications for this visit.     Allergies: Penicillins  Past Medical History:  Diagnosis Date  . Complication of anesthesia    trouble urinating after eye surgery 06  . Inguinal hernia    bilateral  . Rapid palpitations 02/23/2013   a. Tachypalps, frog positive 2014 - suspected AVNRT but not documented. b. Recurrent palps 08/2015, again ceased before they could be recorded.    Past Surgical History:  Procedure Laterality Date  . INGUINAL HERNIA REPAIR Bilateral 06/08/2014   Procedure: LAPAROSCOPIC BILATERAL INGUINAL HERNIA REPAIR ;  Surgeon: Imogene Burn. Georgette Dover, MD;  Location: [facility];  Service: General;  Laterality: Bilateral;  . INSERTION OF MESH Bilateral 06/08/2014   Procedure: INSERTION OF MESH;  Surgeon: Imogene Burn. Georgette Dover, MD;  Location: [facility];  Service: General;  Laterality: Bilateral;  . RETINAL DETACHMENT REPAIR W/ SCLERAL BUCKLE LE Right 06  . SVT ABLATION N/A 12/14/2017   Procedure: SVT ABLATION;  Surgeon: Evans Lance, MD;  Location: [facility];  Service: Cardiovascular;  Laterality: N/A;    Family History  Problem Relation Age of Onset  . CAD Neg Hx        neg hx    Social History    Tobacco Use  . Smoking status: Never Smoker  . Smokeless tobacco: Never Used  Substance Use Topics  . 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  HENT: Negative for hearing loss.   Eyes: Negative for blurred vision.  Respiratory: Negative for cough and shortness of breath.   Cardiovascular: Negative for chest pain and palpitations.  Gastrointestinal: Negative for abdominal pain, constipation, diarrhea and heartburn.  Genitourinary: Negative for dysuria and frequency.  Musculoskeletal: Negative for joint pain and neck pain.  Skin: Negative for rash.  Neurological: Negative for dizziness, weakness and headaches.  Psychiatric/Behavioral: Negative for depression. The patient is not nervous/anxious and does not have insomnia.       Objective:  Vitals:   06/08/18 1100  BP: 114/74  Pulse: 67  Temp: 97.7 F (36.5 C)  TempSrc: Oral  SpO2: 97%  Weight: 180 lb (81.6 kg)  Height:  6' (1.829 m)    General: Well developed, well nourished, in no acute distress  Skin : Warm and dry.  Head: Normocephalic and atraumatic  Eyes: Sclera and conjunctiva clear; pupils round and reactive to light; extraocular movements intact  Ears: External normal; canals clear; tympanic membranes normal  Oropharynx: Pink, supple. No suspicious lesions  Neck: Supple without thyromegaly, adenopathy  Lungs: Respirations unlabored;  clear to auscultation bilaterally without wheeze, rales, rhonchi  CVS exam: normal rate and regular rhythm.  Abdomen: Soft; nontender; nondistended; normoactive bowel sounds; no masses or hepatosplenomegaly  Musculoskeletal: No deformities; no active joint inflammation  Extremities: No edema, cyanosis, clubbing  Vessels: Symmetric bilaterally  Neurologic: Alert and oriented; speech intact; face symmetrical; moves all extremities well; CNII-XII intact without focal deficit   Assessment:  1. PE (physical exam), annual     Plan:  Age appropriate preventive healthcare needs addressed; encouraged regular eye doctor and dental exams; encouraged regular exercise; will update labs and refills as needed today; follow-up to be determined; Patient will get records transferred including information regarding follow-up for CPAP; also he will forward labs for review; Follow-up in 1 year, sooner prn.   No follow-ups on file.  No orders of the defined types were placed in this encounter.   Requested Prescriptions   Signed Prescriptions Disp Refills  . tamsulosin (FLOMAX) 0.4 MG CAPS capsule 90 capsule 3    Sig: Take 1 capsule (0.4 mg total) by mouth daily.  . tadalafil (CIALIS) 10 MG tablet 90 tablet 3    Sig: Take 1 tablet (10 mg total) by mouth daily as needed for erectile dysfunction.

## 2018-06-08 NOTE — Patient Instructions (Signed)

## 2018-06-09 ENCOUNTER — Telehealth: Payer: Self-pay | Admitting: Family

## 2018-06-09 NOTE — Telephone Encounter (Signed)
ROI faxed to [facility]

## 2018-06-10 ENCOUNTER — Telehealth: Payer: Self-pay | Admitting: Family

## 2018-06-10 NOTE — Telephone Encounter (Signed)
Pt dropped off [facility] form for completion.  Form put in [employer] box for pick up & completion.  Please call pt once form is completed and ready for pick up [phone].

## 2018-06-11 NOTE — Telephone Encounter (Signed)
Called and left message for patient that paperwork was ready to be picked up. Left up front in patient pick up section.

## 2018-06-29 NOTE — Telephone Encounter (Signed)
Recv'd records from [facility] forwarded 24 pages to Dr. Marrian Salvage

## 2018-07-30 ENCOUNTER — Ambulatory Visit (INDEPENDENT_AMBULATORY_CARE_PROVIDER_SITE_OTHER): Payer: PRIVATE HEALTH INSURANCE | Admitting: Family

## 2018-07-30 ENCOUNTER — Encounter: Payer: Self-pay | Admitting: Family

## 2018-07-30 VITALS — BP 116/72 | HR 61 | Temp 97.9°F | Ht 72.0 in

## 2018-07-30 DIAGNOSIS — R6889 Other general symptoms and signs: Secondary | ICD-10-CM | POA: Diagnosis not present

## 2018-07-30 LAB — POC INFLUENZA A&B (BINAX/QUICKVUE)
INFLUENZA B, POC: NEGATIVE
Influenza A, POC: NEGATIVE

## 2018-07-30 MED ORDER — OSELTAMIVIR PHOSPHATE 75 MG PO CAPS: 75.0000 mg | ORAL_CAPSULE | Freq: Two times a day (BID) | ORAL | 0 refills | Status: DC

## 2018-07-30 NOTE — Progress Notes (Signed)
Blake Sims is a 57 y.o. male with the following history as recorded in EpicCare:  Patient Active Problem List   Diagnosis Date Noted  . SVT (supraventricular tachycardia) ([place]) 12/14/2017  . OSA (obstructive sleep apnea) 09/12/2015  . Chest pain 09/11/2015  . Bilateral inguinal hernia without obstruction or gangrene 04/25/2014  . Rapid palpitations 02/23/2013    Current Outpatient Medications  Medication Sig Dispense Refill  . Multiple Vitamin (MULTIVITAMIN WITH MINERALS) TABS tablet Take 1 tablet by mouth daily.    . tadalafil (CIALIS) 10 MG tablet Take 1 tablet (10 mg total) by mouth daily as needed for erectile dysfunction. 90 tablet 3  . tamsulosin (FLOMAX) 0.4 MG CAPS capsule Take 1 capsule (0.4 mg total) by mouth daily. 90 capsule 3  . oseltamivir (TAMIFLU) 75 MG capsule Take 1 capsule (75 mg total) by mouth 2 (two) times daily. 10 capsule 0   No current facility-administered medications for this visit.     Allergies: Penicillins  Past Medical History:  Diagnosis Date  . Complication of anesthesia    trouble urinating after eye surgery 06  . Inguinal hernia    bilateral  . Rapid palpitations 02/23/2013   a. Tachypalps, frog positive 2014 - suspected AVNRT but not documented. b. Recurrent palps 08/2015, again ceased before they could be recorded.    Past Surgical History:  Procedure Laterality Date  . INGUINAL HERNIA REPAIR Bilateral 06/08/2014   Procedure: LAPAROSCOPIC BILATERAL INGUINAL HERNIA REPAIR ;  Surgeon: Imogene Burn. Georgette Dover, MD;  Location: [facility];  Service: General;  Laterality: Bilateral;  . INSERTION OF MESH Bilateral 06/08/2014   Procedure: INSERTION OF MESH;  Surgeon: Imogene Burn. Georgette Dover, MD;  Location: [facility];  Service: General;  Laterality: Bilateral;  . RETINAL DETACHMENT REPAIR W/ SCLERAL BUCKLE LE Right 06  . SVT ABLATION N/A 12/14/2017   Procedure: SVT ABLATION;  Surgeon: Evans Lance, MD;  Location: [facility];  Service: Cardiovascular;   Laterality: N/A;    Family History  Problem Relation Age of Onset  . CAD Neg Hx        neg hx    Social History   Tobacco Use  . Smoking status: Never Smoker  . Smokeless tobacco: Never Used  Substance Use Topics  . Alcohol use: No    Subjective:  Patient started with flu-like symptoms yesterday; wife is sick with similar symptoms; fever is up to 101; using Dayquil and Nyquil; no chest pain, no shortness of breath; wife teaches elementary school; has not taken flu shot today; per patient, "it feels like the flu."    Objective:  Vitals:   07/30/18 1019  BP: 116/72  Pulse: 61  Temp: 97.9 F (36.6 C)  TempSrc: Oral  SpO2: 97%  Height: 6' (1.829 m)    General: Well developed, well nourished, in no acute distress  Skin : Warm and dry.  Head: Normocephalic and atraumatic  Eyes: Sclera and conjunctiva clear; pupils round and reactive to light; extraocular movements intact  Ears: External normal; canals clear; tympanic membranes normal  Oropharynx: Pink, supple. No suspicious lesions  Neck: Supple without thyromegaly, bilateral cervical lymphadenopathy Lungs: Respirations unlabored; clear to auscultation bilaterally without wheeze, rales, rhonchi  CVS exam: normal rate and regular rhythm.  Neurologic: Alert and oriented; speech intact; face symmetrical; moves all extremities well; CNII-XII intact without focal deficit   Assessment:  1. Flu-like symptoms     Plan:  Rapid flu is negative but presenting symptoms are concerning; patient is  within 48 hour window- will go ahead and start Tamiflu 75 mg bid x 5 days; increase fluids, rest and follow-up worse, no better.  Encourage to get his flu shot in about 2 weeks.   No follow-ups on file.  No orders of the defined types were placed in this encounter.   Requested Prescriptions   Signed Prescriptions Disp Refills  . oseltamivir (TAMIFLU) 75 MG capsule 10 capsule 0    Sig: Take 1 capsule (75 mg total) by mouth 2 (two) times  daily.

## 2018-07-30 NOTE — Addendum Note (Signed)
Addended by: Marcina Millard on: 07/30/2018 01:11 PM   Modules accepted: Orders

## 2018-09-15 ENCOUNTER — Encounter: Payer: Self-pay | Admitting: Family

## 2018-09-16 ENCOUNTER — Ambulatory Visit (INDEPENDENT_AMBULATORY_CARE_PROVIDER_SITE_OTHER): Payer: PRIVATE HEALTH INSURANCE

## 2018-09-16 DIAGNOSIS — Z23 Encounter for immunization: Secondary | ICD-10-CM | POA: Diagnosis not present

## 2018-09-16 DIAGNOSIS — Z299 Encounter for prophylactic measures, unspecified: Secondary | ICD-10-CM

## 2018-12-10 IMAGING — CT CT ABD-PELV W/ CM
1 of 3 series · 13 of 32 positions shown, 18 images · IV contrast (APPLIED)
Comparison: No comparison CT.

CLINICAL DATA: 56-year-old male with left lower quadrant pain for 2
months. Question diverticulitis. History of inguinal hernia repair
1452. Initial encounter.

EXAM:
CT ABDOMEN AND PELVIS WITH CONTRAST
TECHNIQUE: Multidetector CT imaging of the abdomen and pelvis was performed
using the standard protocol following bolus administration of
intravenous contrast.
CONTRAST:  100mL QZRINO-8JJ IOPAMIDOL (QZRINO-8JJ) INJECTION 61%

[Series 2: abd/pelvis w/cm · axial · 0.68mm/px · z∈[-472,-47]mm · 13 of 96 slices shown, 18 images]
[im 6/96  soft-tissue]
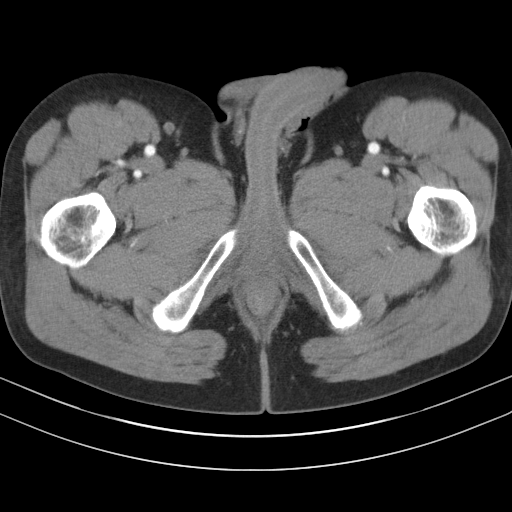
[im 6/96  bone]
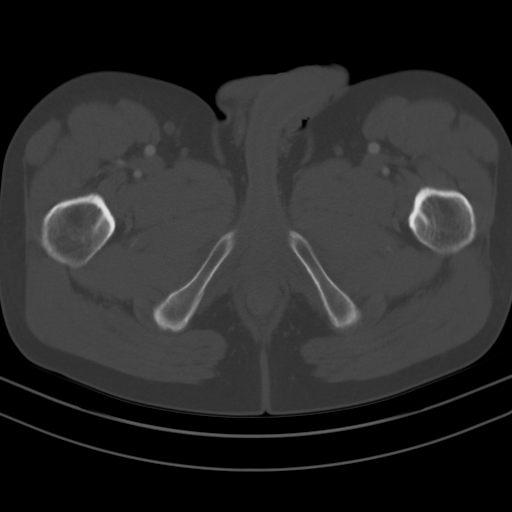
[im 16/96  soft-tissue]
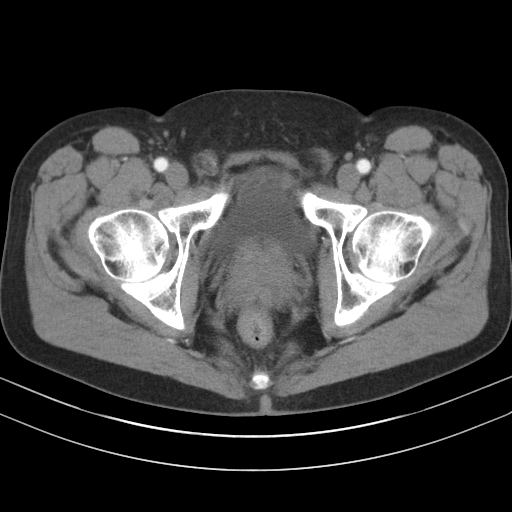
[im 21/96  soft-tissue]
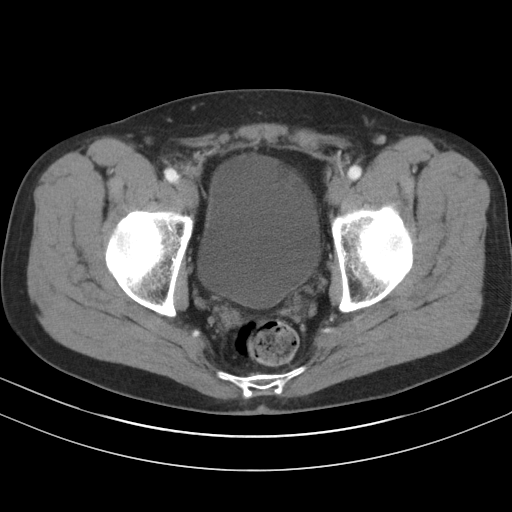
[im 31/96  soft-tissue]
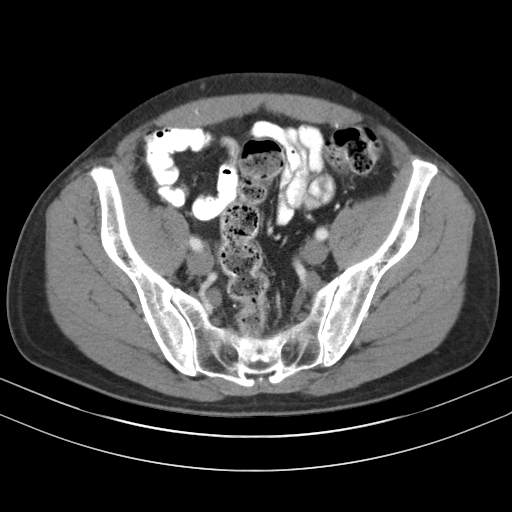
[im 36/96  soft-tissue]
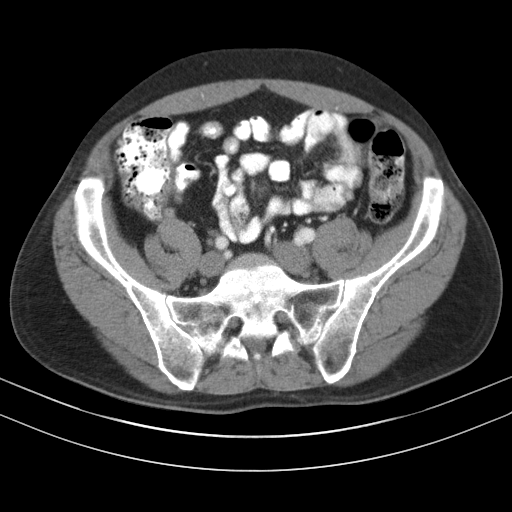
[im 46/96  soft-tissue]
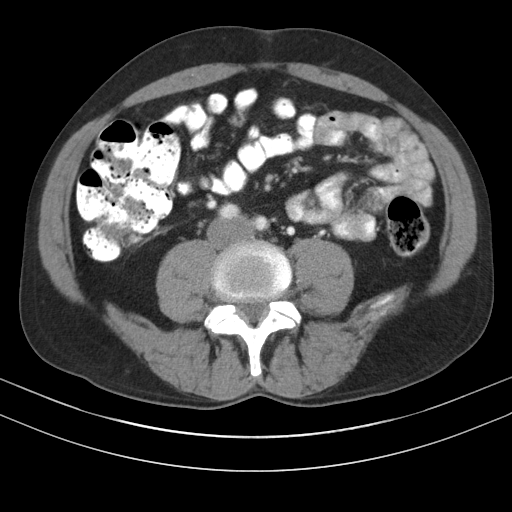
[im 51/96  soft-tissue]
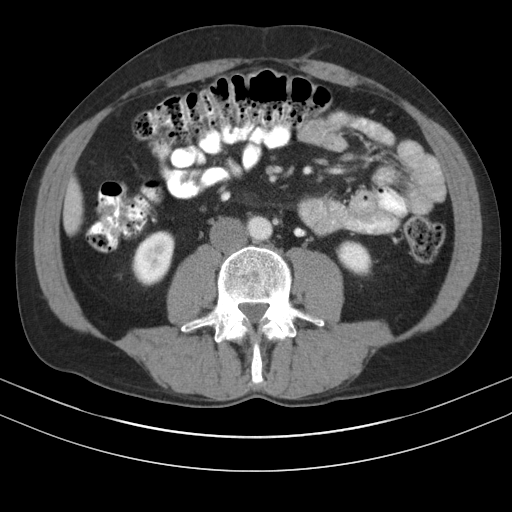
[im 61/96  soft-tissue]
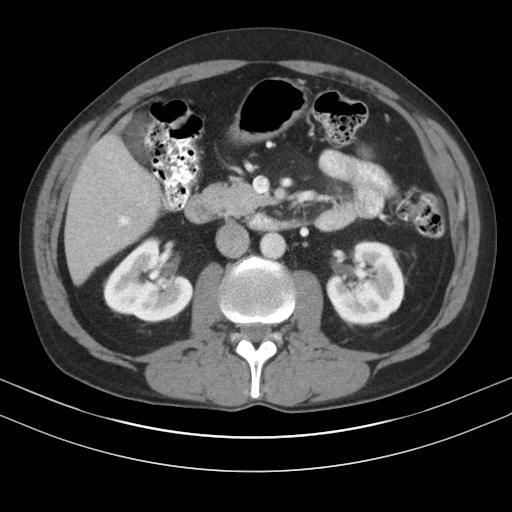
[im 66/96  soft-tissue]
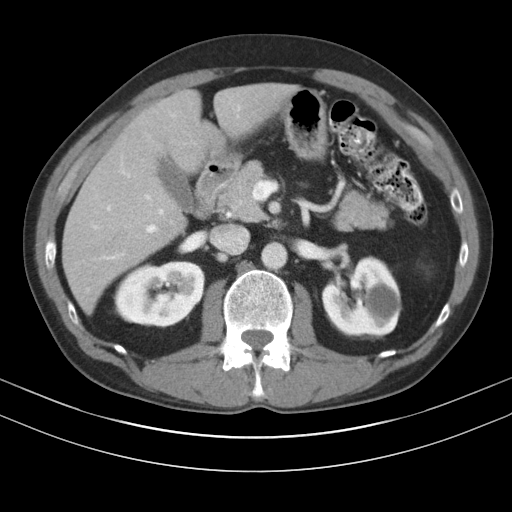
[im 66/96  bone]
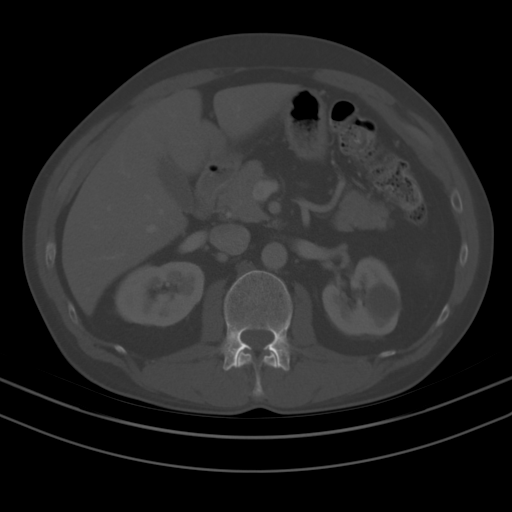
[im 76/96  soft-tissue]
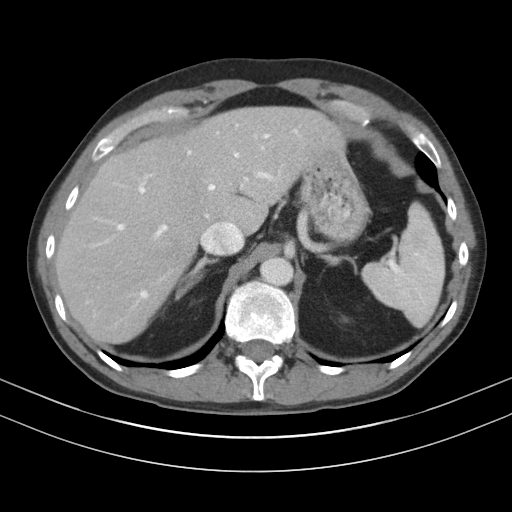
[im 76/96  lung]
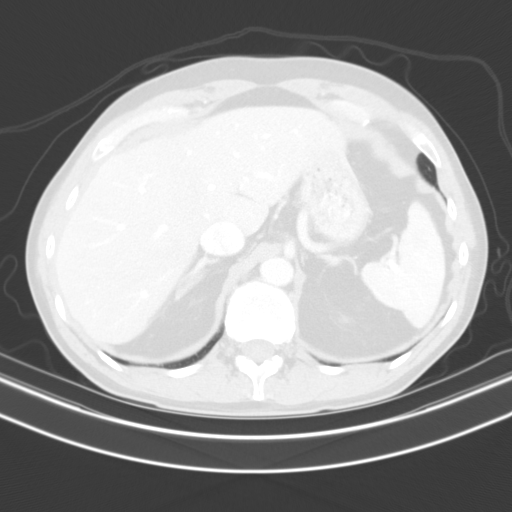
[im 81/96  soft-tissue]
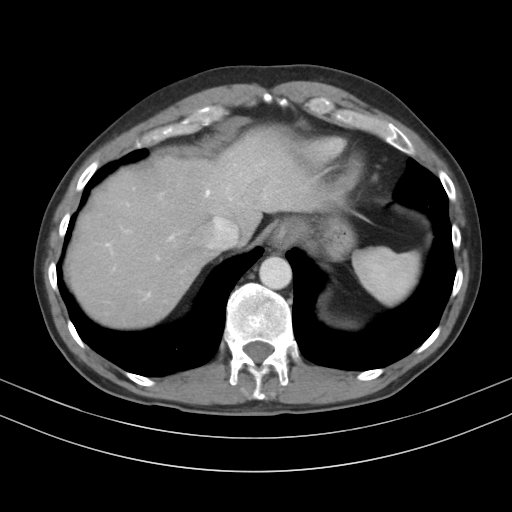
[im 81/96  lung]
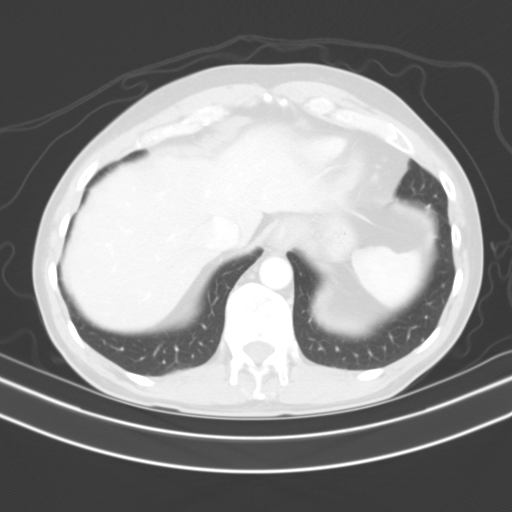
[im 86/96  lung]
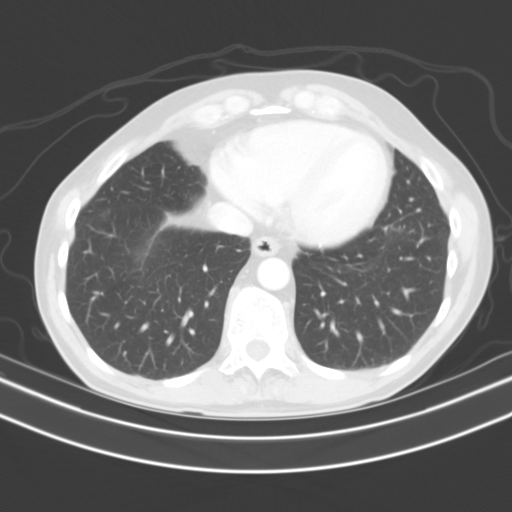
[im 91/96  soft-tissue]
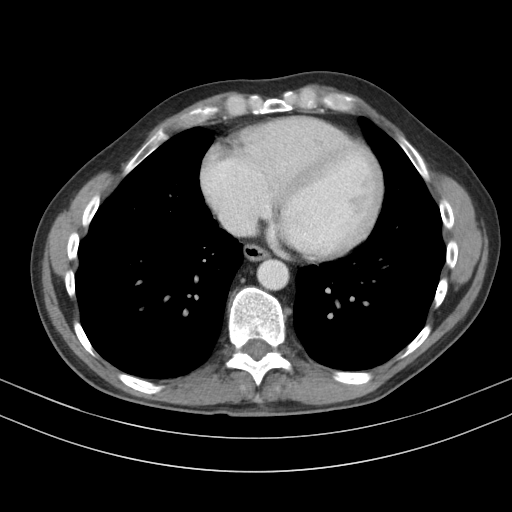
[im 91/96  lung]
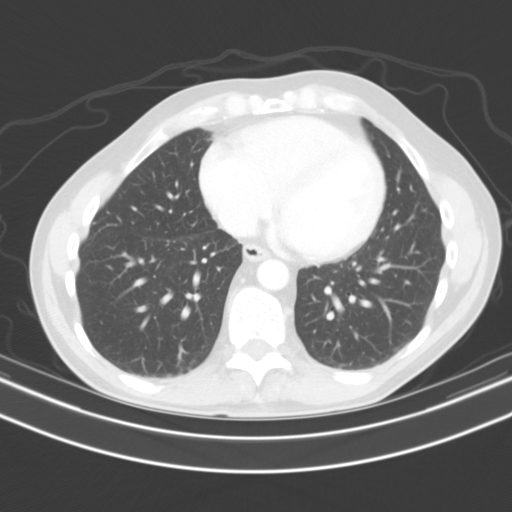

[13 of 32 positions shown; findings below may reference images not displayed]

FINDINGS: Lower chest: Minimal scarring/atelectasis lung bases. Heart size
top-normal.

Hepatobiliary: Top-normal size liver. Left lobe 5 mm low-density
structure too small to characterize possibly a cyst or hamartoma
without worrisome hepatic lesion noted. No calcified gallstone.

Pancreas: Top-normal size prostate gland without mass or
inflammation.

Spleen: No mass.  Minimally lobulated contour.  No enlargement.

Adrenals/Urinary Tract: No adrenal mass.

No obstructing stone or hydronephrosis. Right upper pole 1.5 cm
cyst. Left upper pole 2.5 cm cyst.

Noncontrast filled views of the urinary bladder reveal slight
impression upon the bladder base by prostate gland.

Stomach/Bowel: No extraluminal bowel inflammatory process. Portions
of stomach and bowel underdistended and moderate stool limited
evaluation for detection of mass.

Vascular/Lymphatic: No abdominal aortic aneurysm or major vessel
occlusion.

No adenopathy.

Reproductive: Prominent size prostate gland with lobular contour
causing mild impression upon the bladder base.

Other: No bowel containing hernia. No recurrent inguinal hernia. No
free air or abnormal fluid collection.

Musculoskeletal: Tiny sclerotic focus left femoral head may be a
bone island assuming evaluation of prostate gland is negative for
malignancy.
IMPRESSION: No bowel inflammatory process or bowel containing hernia.

Slightly prominent size prostate gland impresses upon the bladder
base. Clinical and laboratory correlation recommended to help
evaluate the prostate gland.

Tiny sclerotic focus left femoral head, statistically likely a bone
island (assuming prostate gland workup turns out to be normal).

Bilateral renal cysts larger on the left.

## 2019-03-30 ENCOUNTER — Telehealth: Payer: Self-pay

## 2019-03-30 ENCOUNTER — Telehealth: Payer: PRIVATE HEALTH INSURANCE | Admitting: Family

## 2019-03-30 DIAGNOSIS — Z20822 Contact with and (suspected) exposure to covid-19: Secondary | ICD-10-CM

## 2019-03-30 MED ORDER — BENZONATATE 100 MG PO CAPS: 100.0000 mg | ORAL_CAPSULE | Freq: Three times a day (TID) | ORAL | 0 refills | Status: DC | PRN

## 2019-03-30 NOTE — Progress Notes (Signed)
E-Visit for Corona Virus Screening   Your current symptoms could be consistent with the coronavirus.  Call your health care provider or local health department to request and arrange formal testing. Many health care providers can now test patients at their office but not all are.  Please quarantine yourself while awaiting your test results.  [place] [phone], [place], [place] [phone] or visit [email or web address]   Approximately 5 minutes was spent documenting and reviewing patient's chart.    COVID-19 is a respiratory illness with symptoms that are similar to the flu. Symptoms are typically mild to moderate, but there have been cases of severe illness and death due to the virus. The following symptoms may appear 2-14 days after exposure: . Fever . Cough . Shortness of breath or difficulty breathing . Chills . Repeated shaking with chills . Muscle pain . Headache . Sore throat . New loss of taste or smell . Fatigue . Congestion or runny nose . Nausea or vomiting . Diarrhea  It is vitally important that if you feel that you have an infection such as this virus or any other virus that you stay home and away from places where you may spread it to others.  You should self-quarantine for 14 days if you have symptoms that could potentially be coronavirus or have been in close contact a with a person diagnosed with COVID-19 within the last 2 weeks. You should avoid contact with people age 76 and older.   You should wear a mask or cloth face covering over your nose and mouth if you must be around other people or animals, including pets (even at home). Try to stay at least 6 feet away from other people. This will protect the people around you.  You can use medication such as A prescription cough medication called Tessalon Perles 100 mg. You may  take 1-2 capsules every 8 hours as needed for cough.  You may also take acetaminophen (Tylenol) as needed for fever.   Reduce your risk of any infection by using the same precautions used for avoiding the common cold or flu:  Marland Kitchen Wash your hands often with soap and warm water for at least 20 seconds.  If soap and water are not readily available, use an alcohol-based hand sanitizer with at least 60% alcohol.  . If coughing or sneezing, cover your mouth and nose by coughing or sneezing into the elbow areas of your shirt or coat, into a tissue or into your sleeve (not your hands). . Avoid shaking hands with others and consider head nods or verbal greetings only. . Avoid touching your eyes, nose, or mouth with unwashed hands.  . Avoid close contact with people who are sick. . Avoid places or events with large numbers of people in one location, like concerts or sporting events. . Carefully consider travel plans you have or are making. . If you are planning any travel outside or inside the [place], visit the CDC's Travelers' Health webpage for the latest health notices. . If you have some symptoms but not all symptoms, continue to monitor at home and seek medical attention if your symptoms worsen. . If you are having a medical emergency, call 911.  HOME CARE . Only take medications as instructed by your medical team. . Drink plenty of fluids and get plenty of rest. . A steam or ultrasonic humidifier can help if you have congestion.   GET HELP RIGHT AWAY IF YOU HAVE  EMERGENCY WARNING SIGNS** FOR COVID-19. If you or someone is showing any of these signs seek emergency medical care immediately. Call 911 or proceed to your closest emergency facility if: . You develop worsening high fever. . Trouble breathing . Bluish lips or face . Persistent pain or pressure in the chest . New confusion . Inability to wake or stay awake . You cough up blood. . Your symptoms become more severe  **This list is not all  possible symptoms. Contact your medical provider for any symptoms that are sever or concerning to you.   MAKE SURE YOU   Understand these instructions.  Will watch your condition.  Will get help right away if you are not doing well or get worse.  Your e-visit answers were reviewed by a board certified advanced clinical practitioner to complete your personal care plan.  Depending on the condition, your plan could have included both over the counter or prescription medications.  If there is a problem please reply once you have received a response from your provider.  Your safety is important to [place].  If you have drug allergies check your prescription carefully.    You can use MyChart to ask questions about today's visit, request a non-urgent call back, or ask for a work or school excuse for 24 hours related to this e-Visit. If it has been greater than 24 hours you will need to follow up with your provider, or enter a new e-Visit to address those concerns. You will get an e-mail in the next two days asking about your experience.  I hope that your e-visit has been valuable and will speed your recovery. Thank you for using e-visits.

## 2019-03-30 NOTE — Telephone Encounter (Signed)
Patient called and advised of the request for covid testing, he verbalized understanding. Appointment scheduled for tomorrow, 03/31/19 at 1100 at [facility], advised of location and to wear a mask for everyone in the vehicle, he verbalized understanding.

## 2019-03-31 ENCOUNTER — Other Ambulatory Visit: Payer: PRIVATE HEALTH INSURANCE

## 2019-04-13 ENCOUNTER — Other Ambulatory Visit: Payer: Self-pay

## 2019-04-13 ENCOUNTER — Encounter: Payer: Self-pay | Admitting: Osteopathic Medicine

## 2019-04-13 ENCOUNTER — Ambulatory Visit (INDEPENDENT_AMBULATORY_CARE_PROVIDER_SITE_OTHER): Payer: BC Managed Care – PPO | Admitting: Osteopathic Medicine

## 2019-04-13 VITALS — BP 111/66 | HR 55 | Temp 98.1°F | Ht 72.0 in | Wt 157.5 lb

## 2019-04-13 DIAGNOSIS — Z0000 Encounter for general adult medical examination without abnormal findings: Secondary | ICD-10-CM | POA: Diagnosis not present

## 2019-04-13 DIAGNOSIS — Z8042 Family history of malignant neoplasm of prostate: Secondary | ICD-10-CM | POA: Diagnosis not present

## 2019-04-13 DIAGNOSIS — N400 Enlarged prostate without lower urinary tract symptoms: Secondary | ICD-10-CM | POA: Diagnosis not present

## 2019-04-13 DIAGNOSIS — Z9889 Other specified postprocedural states: Secondary | ICD-10-CM | POA: Diagnosis not present

## 2019-04-13 DIAGNOSIS — R6889 Other general symptoms and signs: Secondary | ICD-10-CM

## 2019-04-13 DIAGNOSIS — Z20822 Contact with and (suspected) exposure to covid-19: Secondary | ICD-10-CM

## 2019-04-13 HISTORY — DX: Other specified postprocedural states: Z98.890

## 2019-04-13 HISTORY — DX: Family history of malignant neoplasm of prostate: Z80.42

## 2019-04-13 MED ORDER — TAMSULOSIN HCL 0.4 MG PO CAPS: 0.4000 mg | ORAL_CAPSULE | Freq: Every day | ORAL | 3 refills | Status: DC

## 2019-04-13 NOTE — Progress Notes (Signed)
HPI: Blake Sims is a 58 y.o. male who  has a past medical history of Bilateral inguinal hernia without obstruction or gangrene (0/62/3762), Complication of anesthesia, Inguinal hernia, OSA (obstructive sleep apnea) (09/12/2015), Rapid palpitations (02/23/2013), and SVT (supraventricular tachycardia) ([place]) (12/14/2017).  he presents to [facility] today, 04/13/19,  for chief complaint of: New to establish care  Annual physical     Patient here for annual physical / wellness exam.  See preventive care reviewed as below.   Additional concerns today include:  None  Medical history reviewed and updated as below.      Past medical, surgical, social and family history reviewed:  Patient Active Problem List   Diagnosis Date Noted  . History of cardiac radiofrequency ablation 04/13/2019  . Family history of prostate cancer in father 04/13/2019  . Benign prostatic hyperplasia without lower urinary tract symptoms 04/13/2019  . SVT (supraventricular tachycardia) ([place]) 12/14/2017  . OSA (obstructive sleep apnea) 09/12/2015    Past Surgical History:  Procedure Laterality Date  . INGUINAL HERNIA REPAIR Bilateral 06/08/2014   Procedure: LAPAROSCOPIC BILATERAL INGUINAL HERNIA REPAIR ;  Surgeon: Imogene Burn. Georgette Dover, MD;  Location: [place];  Service: General;  Laterality: Bilateral;  . INSERTION OF MESH Bilateral 06/08/2014   Procedure: INSERTION OF MESH;  Surgeon: Imogene Burn. Georgette Dover, MD;  Location: [place];  Service: General;  Laterality: Bilateral;  . RETINAL DETACHMENT REPAIR W/ SCLERAL BUCKLE LE Right 06  . SVT ABLATION N/A 12/14/2017   Procedure: SVT ABLATION;  Surgeon: Evans Lance, MD;  Location: [place] [facility];  Service: Cardiovascular;  Laterality: N/A;  . VASECTOMY  2011    Social History   Tobacco Use  . Smoking status: Never Smoker  . Smokeless tobacco: Never Used  Substance Use Topics  . Alcohol use: No    Family History  Problem  Relation Age of Onset  . CAD Neg Hx        neg hx     Current medication list and allergy/intolerance information reviewed:    Current Outpatient Medications  Medication Sig Dispense Refill  . Multiple Vitamin (MULTIVITAMIN WITH MINERALS) TABS tablet Take 1 tablet by mouth daily.    . tamsulosin (FLOMAX) 0.4 MG CAPS capsule Take 1 capsule (0.4 mg total) by mouth daily. 90 capsule 3   No current facility-administered medications for this visit.     No Known Allergies    Review of Systems:  Constitutional:  No  fever, no chills, No recent illness, No unintentional weight changes. No significant fatigue.   HEENT: No  headache, no vision change, no hearing change, No sore throat, No  sinus pressure  Cardiac: No  chest pain, No  pressure, No palpitations, No  Orthopnea  Respiratory:  No  shortness of breath. No  Cough  Gastrointestinal: No  abdominal pain, No  nausea, No  vomiting,  No  blood in stool, No  diarrhea, No  constipation   Musculoskeletal: No new myalgia/arthralgia  Skin: No  Rash, No other wounds/concerning lesions  Genitourinary: No  incontinence, No  abnormal genital bleeding, No abnormal genital discharge  Hem/Onc: No  easy bruising/bleeding, No  abnormal lymph node  Endocrine: No cold intolerance,  No heat intolerance. No polyuria/polydipsia/polyphagia   Neurologic: No  weakness, No  dizziness, No  slurred speech/focal weakness/facial droop  Psychiatric: No  concerns with depression, No  concerns with anxiety, No sleep problems, No mood problems  Exam:  BP 111/66 (BP Location: Left  Arm, Patient Position: Sitting, Cuff Size: Normal)   Pulse (!) 55   Temp 98.1 F (36.7 C) (Oral)   Ht 6' (1.829 m)   Wt 157 lb 8 oz (71.4 kg)   BMI 21.36 kg/m   Constitutional: VS see above. General Appearance: alert, well-developed, well-nourished, NAD  Eyes: Normal lids and conjunctive, non-icteric sclera  Neck: No masses, trachea midline. No thyroid enlargement. No  tenderness/mass appreciated. No lymphadenopathy  Respiratory: Normal respiratory effort. no wheeze, no rhonchi, no rales  Cardiovascular: S1/S2 normal, no murmur, no rub/gallop auscultated. RRR. No lower extremity edema.  Gastrointestinal: Nontender, no masses. No hepatomegaly, no splenomegaly. No hernia appreciated. Bowel sounds normal. Rectal exam deferred.   Musculoskeletal: Gait normal. No clubbing/cyanosis of digits.   Neurological: Normal balance/coordination. No tremor. No cranial nerve deficit on limited exam. Motor and sensation intact and symmetric. Cerebellar reflexes intact.   Skin: warm, dry, intact. No rash/ulcer. No concerning nevi or subq nodules on limited exam.    Psychiatric: Normal judgment/insight. Normal mood and affect. Oriented x3.         ASSESSMENT/PLAN: The primary encounter diagnosis was Annual physical exam. Diagnoses of Suspected Covid-19 Virus Infection, Family history of prostate cancer in father, History of cardiac radiofrequency ablation, and Benign prostatic hyperplasia without lower urinary tract symptoms were also pertinent to this visit.    Orders Placed This Encounter  Procedures  . SAR CoV2 Serology (COVID 19)AB(IGG)IA  . CBC  . COMPLETE METABOLIC PANEL WITH GFR  . Lipid panel  . PSA, Total with Reflex to PSA, Free    Meds ordered this encounter  Medications  . tamsulosin (FLOMAX) 0.4 MG CAPS capsule    Sig: Take 1 capsule (0.4 mg total) by mouth daily.    Dispense:  90 capsule    Refill:  3    Patient Instructions  General Preventive Care  Most recent routine screening lipids/other labs: ordered today!   Everyone should have blood pressure checked once per year.   Tobacco: don't!   Alcohol: responsible moderation is ok for most adults - if you have concerns about your alcohol intake, please talk to me!   Exercise: as tolerated to reduce risk of cardiovascular disease and diabetes. Strength training will also prevent  osteoporosis.   Mental health: if need for mental health care (medicines, counseling, other), or concerns about moods, please let me know!   Sexual health: if need for STD testing, or if concerns with libido/pain problems, please let me know!   Advanced Directive: Living Will and/or Healthcare Power of Attorney recommended for all adults, regardless of age or health.  Vaccines  Flu vaccine: recommended for almost everyone, every fall.   Shingles vaccine: Shingrix recommended after age 69. Some people have had the old Zostavax vaccine. If you want the Shingrix, call us and we can get it set up for you!   Pneumonia vaccines: Prevnar and Pneumovax recommended after age 73, or sooner if certain medical conditions.  Tetanus booster: Tdap recommended every 10 years.  Cancer screenings   Colon cancer screening: recommended for everyone at age 26-75, please follow-up as directed based on last colonoscopy results!   Prostate cancer screening: PSA blood test annual age 30-71  Lung cancer screening: not needed for non-smokers Infection screenings . HIV: recommended screening at least once age 48-65, more often as needed. . Gonorrhea/Chlamydia: screening as needed . Hepatitis C: recommended for anyone born 86-1966 . TB: certain at-risk populations, or depending on work requirements and/or travel history Other .  Bone Density Test: recommended for men at age 39, sooner depending on risk factors . Abdominal Aortic Aneurysm: not needed for non-smokers         Visit summary with medication list and pertinent instructions was printed for patient to review. All questions at time of visit were answered - patient instructed to contact office with any additional concerns or updates. ER/RTC precautions were reviewed with the patient.    Please note: voice recognition software was used to produce this document, and typos may escape review. Please contact Dr. Sheppard Coil for any needed  clarifications.     Follow-up plan: Return in about 1 year (around 04/12/2020) for [place] (call week prior to visit for lab orders).

## 2019-04-13 NOTE — Patient Instructions (Signed)
General Preventive Care  Most recent routine screening lipids/other labs: ordered today!   Everyone should have blood pressure checked once per year.   Tobacco: don't!   Alcohol: responsible moderation is ok for most adults - if you have concerns about your alcohol intake, please talk to me!   Exercise: as tolerated to reduce risk of cardiovascular disease and diabetes. Strength training will also prevent osteoporosis.   Mental health: if need for mental health care (medicines, counseling, other), or concerns about moods, please let me know!   Sexual health: if need for STD testing, or if concerns with libido/pain problems, please let me know!   Advanced Directive: Living Will and/or Healthcare Power of Attorney recommended for all adults, regardless of age or health.  Vaccines  Flu vaccine: recommended for almost everyone, every fall.   Shingles vaccine: Shingrix recommended after age 71. Some people have had the old Zostavax vaccine. If you want the Shingrix, call us and we can get it set up for you!   Pneumonia vaccines: Prevnar and Pneumovax recommended after age 39, or sooner if certain medical conditions.  Tetanus booster: Tdap recommended every 10 years.  Cancer screenings   Colon cancer screening: recommended for everyone at age 73-75, please follow-up as directed based on last colonoscopy results!   Prostate cancer screening: PSA blood test annual age 38-71  Lung cancer screening: not needed for non-smokers Infection screenings . HIV: recommended screening at least once age 29-65, more often as needed. . Gonorrhea/Chlamydia: screening as needed . Hepatitis C: recommended for anyone born [birth year]-1965 . TB: certain at-risk populations, or depending on work requirements and/or travel history Other . Bone Density Test: recommended for men at age 68, sooner depending on risk factors . Abdominal Aortic Aneurysm: not needed for non-smokers

## 2019-04-14 LAB — CBC
HCT: 43.1 % (ref 38.5–50.0)
Hemoglobin: 15 g/dL (ref 13.2–17.1)
MCH: 32 pg (ref 27.0–33.0)
MCHC: 34.8 g/dL (ref 32.0–36.0)
MCV: 91.9 fL (ref 80.0–100.0)
MPV: 9.1 fL (ref 7.5–12.5)
Platelets: 203 10*3/uL (ref 140–400)
RBC: 4.69 10*6/uL (ref 4.20–5.80)
RDW: 12.5 % (ref 11.0–15.0)
WBC: 5 10*3/uL (ref 3.8–10.8)

## 2019-04-14 LAB — LIPID PANEL
Cholesterol: 175 mg/dL (ref <200)
HDL: 62 mg/dL (ref >=40)
LDL Cholesterol (Calc): 95 mg/dL (calc)
Non-HDL Cholesterol (Calc): 113 mg/dL (calc) (ref <130)
Total CHOL/HDL Ratio: 2.8 (calc) (ref <5.0)
Triglycerides: 86 mg/dL (ref <150)

## 2019-04-14 LAB — COMPLETE METABOLIC PANEL WITH GFR
AG Ratio: 2 (calc) (ref 1.0–2.5)
ALT: 25 U/L (ref 9–46)
AST: 24 U/L (ref 10–35)
Albumin: 4.5 g/dL (ref 3.6–5.1)
Alkaline phosphatase (APISO): 61 U/L (ref 35–144)
BUN: 14 mg/dL (ref 7–25)
CO2: 32 mmol/L (ref 20–32)
Calcium: 10.1 mg/dL (ref 8.6–10.3)
Chloride: 103 mmol/L (ref 98–110)
Creat: 0.94 mg/dL (ref 0.70–1.33)
GFR, Est African American: 102 mL/min/{1.73_m2} (ref >=60)
GFR, Est Non African American: 88 mL/min/{1.73_m2} (ref >=60)
Globulin: 2.3 g/dL (calc) (ref 1.9–3.7)
Glucose, Bld: 88 mg/dL (ref 65–99)
Potassium: 4.8 mmol/L (ref 3.5–5.3)
Sodium: 139 mmol/L (ref 135–146)
Total Bilirubin: 0.8 mg/dL (ref 0.2–1.2)
Total Protein: 6.8 g/dL (ref 6.1–8.1)

## 2019-04-14 LAB — PSA, TOTAL WITH REFLEX TO PSA, FREE: PSA, Total: 2.6 ng/mL (ref <=4.0)

## 2019-04-14 LAB — SAR COV2 SEROLOGY (COVID19)AB(IGG),IA: SARS CoV2 AB IGG: NEGATIVE

## 2019-06-13 DIAGNOSIS — L814 Other melanin hyperpigmentation: Secondary | ICD-10-CM | POA: Diagnosis not present

## 2019-06-13 DIAGNOSIS — L821 Other seborrheic keratosis: Secondary | ICD-10-CM | POA: Diagnosis not present

## 2019-06-13 DIAGNOSIS — D1801 Hemangioma of skin and subcutaneous tissue: Secondary | ICD-10-CM | POA: Diagnosis not present

## 2019-07-22 ENCOUNTER — Telehealth: Payer: Self-pay | Admitting: Internal Medicine

## 2019-07-22 ENCOUNTER — Telehealth: Payer: Self-pay

## 2019-07-22 NOTE — Telephone Encounter (Signed)
Pt states he was under the impression that he was diagnosed with atrial fibrilation back in March of 2019 however Dr. Forde Dandy notes do not mention Afib, only SVT therefore the Afib clinic is unable to see pt unless seen by gen cards. Pt will be seen on Tues Oct 13 by Dr. Acie Fredrickson. Pt is not currently experiencing any symptoms. He reports only being symptomatic when he has episodes of an accelerated HR and shortly after the episode has ended. Pt has been unable to get an accurate reading on HR during these episodes d/t 'error' messages that appear on his HR monitor. Pt states HR is too fast to count during these episodes.

## 2019-07-22 NOTE — Telephone Encounter (Signed)
Patient c/o Palpitations:  High priority if patient c/o lightheadedness, shortness of breath, or chest pain  1) How long have you had palpitations/irregular HR/ Afib? Are you having the symptoms now?  It started a couple of months ago, but it was not regular- yesterday he had it for a long period of time  2) Are you currently experiencing lightheadedness, SOB or CP? When this happen he is lightheaded, sob,chest pain  3) Do you have a history of afib (atrial fibrillation) or irregular heart rhythm? yes*  4) Have you checked your BP or HR? (document readings if available):   5) Are you experiencing any other symptoms? No- thinks he needs to be seen by somebody*

## 2019-07-22 NOTE — Telephone Encounter (Signed)
Pt started having episodes of fast HR, "flutter", headache followed with chest pain. Pt states he thinks he may be experiencing periods of Afib again. Pt has had ablation by Dr. Lovena Le in the past. Sonia Baller, RN ([facility] nurse) would like pt to be seen by a general cardiologist. Appt made for pt with Dr. Acie Fredrickson on 10/13.

## 2019-07-22 NOTE — Telephone Encounter (Signed)
Attempting to get pt appt with Afib clinic today.

## 2019-07-25 NOTE — Progress Notes (Signed)
Cardiology Office Note:    Date:  07/27/2019   ID:  Blake Sims, DOB Feb 15, 1961, MRN [id number]  PCP:  Emeterio Reeve, DO  Cardiologist:    Aayla Marrocco  Electrophysiologist:  Lovena Le   Referring MD: Emeterio Reeve, DO   Chief Complaint  Patient presents with  . Palpitations    Oct. 13, 2020    Blake Sims is a 58 y.o. male with a hx of SVT - s/p ablation by Dr. Lovena Le - March 2019.   He now has had several episodes of a rapid, regular episode  Might last for 30 seconds - 1 minute or so  He terminates with a cough  He performed carotid sinus massage and immediately corrected the SVT.  these paplitations returned several months ago Originally thougtht they were caffiene related.  Has largely cut out his caffeine intake.  He drinks decaffeinated coffee and only drinks 1 or 2 cups a day.  He avoids chocolate.  Also has GERD so he avoids peppermint, caffiene.   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      Past Medical History:  Diagnosis Date  . Bilateral inguinal hernia without obstruction or gangrene 04/25/2014  . Complication of anesthesia    trouble urinating after eye surgery 06  . Family history of prostate cancer in father 04/13/2019  . History of cardiac radiofrequency ablation 04/13/2019  . Inguinal hernia    bilateral  . OSA (obstructive sleep apnea) 09/12/2015  . Rapid palpitations    a. Tachypalps, frog positive 2014 - suspected AVNRT but not documented. b. Recurrent palps 08/2015, again ceased before they could be recorded.  . SVT (supraventricular tachycardia) ([place]) 12/14/2017    Past Surgical History:  Procedure Laterality Date  . INGUINAL HERNIA REPAIR Bilateral 06/08/2014   Procedure: LAPAROSCOPIC BILATERAL INGUINAL HERNIA REPAIR ;  Surgeon: Imogene Burn. Georgette Dover, MD;  Location: [place];  Service: General;  Laterality: Bilateral;  . INSERTION OF MESH Bilateral 06/08/2014   Procedure: INSERTION OF MESH;  Surgeon: Imogene Burn. Georgette Dover, MD;  Location: [place];  Service: General;  Laterality: Bilateral;  . RETINAL DETACHMENT REPAIR W/ SCLERAL BUCKLE LE Right 06  . SVT ABLATION N/A 12/14/2017   Procedure: SVT ABLATION;  Surgeon: Evans Lance, MD;  Location: [place] [facility];  Service: Cardiovascular;  Laterality: N/A;  . VASECTOMY  2011    Current Medications: Current Meds  Medication Sig  . Multiple Vitamin (MULTIVITAMIN WITH MINERALS) TABS tablet Take 1 tablet by mouth daily.  . tamsulosin (FLOMAX) 0.4 MG CAPS capsule Take 1 capsule (0.4 mg total) by mouth daily.     Allergies:   Patient has no known allergies.   Social History   Socioeconomic History  . Marital status: Married    Spouse name: Not on file  . Number of children: Not on file  . Years of education: Not on file  . Highest education level: Not on file  Occupational History  . Occupation: Ecologist: [employer]  Social Needs  . Financial resource strain: Not hard at all  . Food insecurity    Worry: Patient refused    Inability: Patient refused  . Transportation needs    Medical: Patient refused    Non-medical: Patient refused  Tobacco Use  . Smoking status: Never Smoker  . Smokeless tobacco: Never Used  Substance and Sexual Activity  . Alcohol use: No  .  Drug use: No  . Sexual activity: Yes    Partners: Female    Birth control/protection: Other-see comments    Comment: Vasectomy  Lifestyle  . Physical activity    Days per week: Not on file    Minutes per session: Not on file  . Stress: Not on file  Relationships  . Social Herbalist on phone: Not on file    Gets together: Not on file    Attends religious service: Not on file    Active member of club or organization: Not on file    Attends meetings of clubs or organizations: Not on file    Relationship status: Not on file  Other Topics Concern  . Not on file  Social History Narrative  . Not  on file     Family History: The patient's family history includes High blood pressure in his father; Skin cancer in his mother. There is no history of CAD.  ROS:   Please see the history of present illness.     All other systems reviewed and are negative.  EKGs/Labs/Other Studies Reviewed:    The following studies were reviewed today:   EKG:   July 26, 2019: Normal sinus rhythm at 70 beats a minute.  Normal EKG.  No evidence of WPW.  Normal intervals.  Recent Labs: 04/13/2019: ALT 25; BUN 14; Creat 0.94; Hemoglobin 15.0; Platelets 203; Potassium 4.8; Sodium 139 07/26/2019: TSH 0.676  Recent Lipid Panel    Component Value Date/Time   CHOL 175 04/13/2019 1146   TRIG 86 04/13/2019 1146   HDL 62 04/13/2019 1146   CHOLHDL 2.8 04/13/2019 1146   VLDL 18 09/12/2015 0120   LDLCALC 95 04/13/2019 1146    Physical Exam:    VS:  BP 116/72   Pulse 70   Ht 6' (1.829 m)   Wt 165 lb 12.8 oz (75.2 kg)   SpO2 97%   BMI 22.49 kg/m     Wt Readings from Last 3 Encounters:  07/26/19 165 lb 12.8 oz (75.2 kg)  04/13/19 157 lb 8 oz (71.4 kg)  06/08/18 180 lb (81.6 kg)     GEN:  Well nourished, well developed in no acute distress HEENT: Normal NECK: No JVD; No carotid bruits LYMPHATICS: No lymphadenopathy CARDIAC: RRR, no murmurs, rubs, gallops RESPIRATORY:  Clear to auscultation without rales, wheezing or rhonchi  ABDOMEN: Soft, non-tender, non-distended MUSCULOSKELETAL:  No edema; No deformity  SKIN: Warm and dry NEUROLOGIC:  Alert and oriented x 3 PSYCHIATRIC:  Normal affect   ASSESSMENT:    1. Palpitations    PLAN:    In order of problems listed above:  1. Supraventricular tachycardia: Blake Sims presents today with recurrent symptoms consistent with supraventricular tachycardia.  He had an SVT ablation 1/2 years ago.  He did great for approximately year and a half and then started having recurrent palpitations about 1 or 2 months ago.  These now have gradually increase in  such that he is having them every week or so.  We will place an event monitor to try to catch 1 of these episodes.  I will give him some propranolol to take on an as-needed basis.  I will see him in 3 months for follow-up visit.  Therefore fortunate enough to catch his SVT on the monitor we discussed the results with Dr. Lovena Le for consideration for repeat SVT ablation.  We discussed the various treatment options.  He has already learned the Valsalva maneuver and carotid sinus  massage.  We discussed long-term low-dose beta-blocker therapy, as needed beta-blocker therapy, long-term diltiazem therapy and repeat ablation.  Medication Adjustments/Labs and Tests Ordered: Current medicines are reviewed at length with the patient today.  Concerns regarding medicines are outlined above.  Orders Placed This Encounter  Procedures  . TSH  . CARDIAC EVENT MONITOR  . EKG 12-Lead   Meds ordered this encounter  Medications  . propranolol (INDERAL) 10 MG tablet    Sig: Take 1 tablet (10 mg total) by mouth 4 (four) times daily as needed (palpitations).    Dispense:  30 tablet    Refill:  3    Patient Instructions  Medication Instructions:  Your physician has recommended you make the following change in your medication:  1-TAKE Propranolol 10 mg by mouth 4 times daily as needed for palpitations.   If you need a refill on your cardiac medications before your next appointment, please call your pharmacy.   Lab work: Your physician recommends that you have lab work today- TSH  If you have labs (blood work) drawn today and your tests are completely normal, you will receive your results only by: Marland Kitchen MyChart Message (if you have MyChart) OR . A paper copy in the mail If you have any lab test that is abnormal or we need to change your treatment, we will call you to review the results.  Testing/Procedures: Your physician has recommended that you wear an event monitor. Event monitors are medical devices  that record the heart's electrical activity. Doctors most often [place] these monitors to diagnose arrhythmias. Arrhythmias are problems with the speed or rhythm of the heartbeat. The monitor is a small, portable device. You can wear one while you do your normal daily activities. This is usually used to diagnose what is causing palpitations/syncope (passing out).  Follow-Up: At [facility], you and your health needs are our priority.  As part of our continuing mission to provide you with exceptional heart care, we have created designated Provider Care Teams.  These Care Teams include your primary Cardiologist (physician) and Advanced Practice Providers (APPs -  Physician Assistants and Nurse Practitioners) who all work together to provide you with the care you need, when you need it. You will need a follow up appointment in:  3 months.   You may see Dr. Acie Fredrickson or one of the following Advanced Practice Providers on your designated Care Team: Richardson Dopp, PA-C [place], [place] . Daune Perch, NP       Signed, Mertie Moores, MD  07/27/2019 6:00 PM    [place]

## 2019-07-26 ENCOUNTER — Encounter: Payer: Self-pay | Admitting: Cardiovascular Disease

## 2019-07-26 ENCOUNTER — Other Ambulatory Visit: Payer: Self-pay

## 2019-07-26 ENCOUNTER — Ambulatory Visit: Payer: BC Managed Care – PPO | Admitting: Cardiovascular Disease

## 2019-07-26 VITALS — BP 116/72 | HR 70 | Ht 72.0 in | Wt 165.8 lb

## 2019-07-26 DIAGNOSIS — I471 Supraventricular tachycardia: Secondary | ICD-10-CM

## 2019-07-26 DIAGNOSIS — R002 Palpitations: Secondary | ICD-10-CM

## 2019-07-26 MED ORDER — PROPRANOLOL HCL 10 MG PO TABS: 10.0000 mg | ORAL_TABLET | Freq: Four times a day (QID) | ORAL | 3 refills | Status: DC | PRN

## 2019-07-26 NOTE — Patient Instructions (Addendum)
Medication Instructions:  Your physician has recommended you make the following change in your medication:  1-TAKE Propranolol 10 mg by mouth 4 times daily as needed for palpitations.   If you need a refill on your cardiac medications before your next appointment, please call your pharmacy.   Lab work: Your physician recommends that you have lab work today- TSH  If you have labs (blood work) drawn today and your tests are completely normal, you will receive your results only by: Marland Kitchen MyChart Message (if you have MyChart) OR . A paper copy in the mail If you have any lab test that is abnormal or we need to change your treatment, we will call you to review the results.  Testing/Procedures: Your physician has recommended that you wear an event monitor. Event monitors are medical devices that record the heart's electrical activity. Doctors most often [place] these monitors to diagnose arrhythmias. Arrhythmias are problems with the speed or rhythm of the heartbeat. The monitor is a small, portable device. You can wear one while you do your normal daily activities. This is usually used to diagnose what is causing palpitations/syncope (passing out).  Follow-Up: At [facility], you and your health needs are our priority.  As part of our continuing mission to provide you with exceptional heart care, we have created designated Provider Care Teams.  These Care Teams include your primary Cardiologist (physician) and Advanced Practice Providers (APPs -  Physician Assistants and Nurse Practitioners) who all work together to provide you with the care you need, when you need it. You will need a follow up appointment in:  3 months.   You may see Dr. Acie Fredrickson or one of the following Advanced Practice Providers on your designated Care Team: Richardson Dopp, PA-C [place], [place] . Daune Perch, NP

## 2019-07-27 LAB — TSH: TSH: 0.676 u[IU]/mL (ref 0.450–4.500)

## 2019-07-29 ENCOUNTER — Telehealth: Payer: Self-pay

## 2019-07-29 NOTE — Telephone Encounter (Signed)
Spoke to pt, went over brief monitor instructions. Verified address. Ordered 30 day [employer] Event Monitor to be mailed to pt's home address.

## 2019-08-10 ENCOUNTER — Ambulatory Visit (INDEPENDENT_AMBULATORY_CARE_PROVIDER_SITE_OTHER): Payer: BC Managed Care – PPO

## 2019-08-10 DIAGNOSIS — R002 Palpitations: Secondary | ICD-10-CM | POA: Diagnosis not present

## 2019-09-12 ENCOUNTER — Encounter: Payer: Self-pay | Admitting: Osteopathic Medicine

## 2019-09-12 ENCOUNTER — Other Ambulatory Visit: Payer: Self-pay

## 2019-09-12 DIAGNOSIS — Z20822 Contact with and (suspected) exposure to covid-19: Secondary | ICD-10-CM

## 2019-09-15 DIAGNOSIS — Z20828 Contact with and (suspected) exposure to other viral communicable diseases: Secondary | ICD-10-CM | POA: Diagnosis not present

## 2019-09-15 LAB — NOVEL CORONAVIRUS, NAA: SARS-CoV-2, NAA: NOT DETECTED

## 2019-09-20 ENCOUNTER — Other Ambulatory Visit: Payer: Self-pay

## 2019-09-20 ENCOUNTER — Emergency Department
Admission: EM | Admit: 2019-09-20 | Discharge: 2019-09-20 | Disposition: A | Discharge status: Home / Self Care | Payer: BC Managed Care – PPO | Source: Home / Self Care

## 2019-09-20 ENCOUNTER — Encounter: Payer: Self-pay | Admitting: Emergency Medicine

## 2019-09-20 ENCOUNTER — Emergency Department (INDEPENDENT_AMBULATORY_CARE_PROVIDER_SITE_OTHER): Payer: BC Managed Care – PPO

## 2019-09-20 ENCOUNTER — Ambulatory Visit (INDEPENDENT_AMBULATORY_CARE_PROVIDER_SITE_OTHER)
Admission: RE | Admit: 2019-09-20 | Discharge: 2019-09-20 | Disposition: A | Discharge status: Other Acute Inpatient Hospital | Payer: BC Managed Care – PPO | Source: Ambulatory Visit

## 2019-09-20 DIAGNOSIS — R079 Chest pain, unspecified: Secondary | ICD-10-CM

## 2019-09-20 DIAGNOSIS — J0120 Acute ethmoidal sinusitis, unspecified: Secondary | ICD-10-CM

## 2019-09-20 DIAGNOSIS — R0789 Other chest pain: Secondary | ICD-10-CM

## 2019-09-20 MED ORDER — AMOXICILLIN-POT CLAVULANATE 875-125 MG PO TABS: 1.0000 | ORAL_TABLET | Freq: Two times a day (BID) | ORAL | 0 refills | Status: AC

## 2019-09-20 NOTE — Discharge Instructions (Addendum)
Go immediately to be seen in person at the urgent care or emergency department.

## 2019-09-20 NOTE — Discharge Instructions (Addendum)
See your Cardiologist for recheck.  Cardiology will call you to set up an appointment time.   Go to the Emergency Department if symptoms worsen or cahnge

## 2019-09-20 NOTE — ED Triage Notes (Signed)
Pt has been having sinus pressure, facial pain and some chest pressure x 1 week. States his son was dx with covid last Monday but he had a negative covid test done Monday and Friday.

## 2019-09-20 NOTE — ED Provider Notes (Signed)
Virtual Visit via Video Note:  Blake Sims  initiated request for Telemedicine visit with [facility] team. I connected with Blake Sims  on 09/20/2019 at 10:34 AM  for a synchronized telemedicine visit using a video enabled HIPPA compliant telemedicine application. I verified that I am speaking with Blake Bond Hable  using two identifiers. Blake Balloon, NP  was physically located in a [facility] Urgent care site and Blake Sims was located at a different location.   The limitations of evaluation and management by telemedicine as well as the availability of in-person appointments were discussed. Patient was informed that he  may incur a bill ( including co-pay) for this virtual visit encounter. Blake Bond Pharris  expressed understanding and gave verbal consent to proceed with virtual visit.     History of Present Illness:Blake Sims  is a 58 y.o. male presents for evaluation of sinus pressure and "chest burning".   No Known Allergies   Past Medical History:  Diagnosis Date  . Bilateral inguinal hernia without obstruction or gangrene 04/25/2014  . Complication of anesthesia    trouble urinating after eye surgery 06  . Family history of prostate cancer in father 04/13/2019  . History of cardiac radiofrequency ablation 04/13/2019  . Inguinal hernia    bilateral  . OSA (obstructive sleep apnea) 09/12/2015  . Rapid palpitations    a. Tachypalps, frog positive 2014 - suspected AVNRT but not documented. b. Recurrent palps 08/2015, again ceased before they could be recorded.  . SVT (supraventricular tachycardia) ([place]) 12/14/2017     Social History   Tobacco Use  . Smoking status: Never Smoker  . Smokeless tobacco: Never Used  Substance Use Topics  . Alcohol use: No  . Drug use: No        Observations/Objective: Physical Exam  GENERAL: Alert, appears well and in no acute distress. HEENT: Atraumatic. NECK: Normal movements of the head and  neck. CARDIOPULMONARY: No increased WOB. Speaking in clear sentences. I:E ratio WNL.  MS: Moves all visible extremities without noticeable abnormality. PSYCH: Pleasant and cooperative, well-groomed. Speech normal rate and rhythm. Affect is appropriate. Insight and judgement are appropriate. Attention is focused, linear, and appropriate.  NEURO: CN grossly intact. Oriented as arrived to appointment on time with no prompting. Moves both UE equally.    Assessment and Plan:    ICD-10-CM   1. Chest discomfort  R07.89        Follow Up Instructions: Instructed patient to go immediately to the urgent care or emergency department for evaluation of his "chest burning".    I discussed the assessment and treatment plan with the patient. The patient was provided an opportunity to ask questions and all were answered. The patient agreed with the plan and demonstrated an understanding of the instructions.   The patient was advised to call back or seek an in-person evaluation if the symptoms worsen or if the condition fails to improve as anticipated.      Blake Balloon, NP  09/20/2019 10:34 AM        Blake Balloon, NP 09/20/19 1035

## 2019-09-20 NOTE — ED Provider Notes (Signed)
[facility]    CSN: [id number] Arrival date & time: 09/20/19  1129      History   Chief Complaint Chief Complaint  Patient presents with  . Facial Pain    HPI Blake Sims is a 58 y.o. male.   Patient complains of burning in his chest for the past week.  Patient had a online visit with the provider at urgent care today who advised him to come in for evaluation.  Patient reports that the pain is continuous.  Pain is sometimes more severe but is always present.  Patient also reports he has sinus congestion and sinus pain.  Patient son has had Covid patient has had 2 negative Covid test.  He has a past medical history of SVT he has been followed by Dr. Lovena Le.  Patient had an ablation in 2019.  Patient denies any current fever he does not have any chills.  Patient denies any sweating with pain he denies any radiation to jaw or down his arm he has not had any nausea.  The history is provided by the patient. No language interpreter was used.    Past Medical History:  Diagnosis Date  . Bilateral inguinal hernia without obstruction or gangrene 04/25/2014  . Complication of anesthesia    trouble urinating after eye surgery 06  . Family history of prostate cancer in father 04/13/2019  . History of cardiac radiofrequency ablation 04/13/2019  . Inguinal hernia    bilateral  . OSA (obstructive sleep apnea) 09/12/2015  . Rapid palpitations    a. Tachypalps, frog positive 2014 - suspected AVNRT but not documented. b. Recurrent palps 08/2015, again ceased before they could be recorded.  . SVT (supraventricular tachycardia) ([place]) 12/14/2017    Patient Active Problem List   Diagnosis Date Noted  . History of cardiac radiofrequency ablation 04/13/2019  . Family history of prostate cancer in father 04/13/2019  . Benign prostatic hyperplasia without lower urinary tract symptoms 04/13/2019  . SVT (supraventricular tachycardia) ([place]) 12/14/2017  . OSA (obstructive sleep apnea)  09/12/2015    Past Surgical History:  Procedure Laterality Date  . INGUINAL HERNIA REPAIR Bilateral 06/08/2014   Procedure: LAPAROSCOPIC BILATERAL INGUINAL HERNIA REPAIR ;  Surgeon: Imogene Burn. Georgette Dover, MD;  Location: [place];  Service: General;  Laterality: Bilateral;  . INSERTION OF MESH Bilateral 06/08/2014   Procedure: INSERTION OF MESH;  Surgeon: Imogene Burn. Georgette Dover, MD;  Location: [place];  Service: General;  Laterality: Bilateral;  . RETINAL DETACHMENT REPAIR W/ SCLERAL BUCKLE LE Right 06  . SVT ABLATION N/A 12/14/2017   Procedure: SVT ABLATION;  Surgeon: Evans Lance, MD;  Location: [place] [facility];  Service: Cardiovascular;  Laterality: N/A;  . VASECTOMY  2011       Home Medications    Prior to Admission medications   Medication Sig Start Date End Date Taking? Authorizing Provider  amoxicillin-clavulanate (AUGMENTIN) 875-125 MG tablet Take 1 tablet by mouth every 12 (twelve) hours for 10 days. 09/20/19 09/30/19  Fransico Meadow, PA-C  Multiple Vitamin (MULTIVITAMIN WITH MINERALS) TABS tablet Take 1 tablet by mouth daily.    [provider]  propranolol (INDERAL) 10 MG tablet Take 1 tablet (10 mg total) by mouth 4 (four) times daily as needed (palpitations). 07/26/19   Nahser, Wonda Cheng, MD  tamsulosin (FLOMAX) 0.4 MG CAPS capsule Take 1 capsule (0.4 mg total) by mouth daily. 04/13/19   Emeterio Reeve, DO    Family History Family History  Problem Relation Age  of Onset  . Skin cancer Mother   . High blood pressure Father   . CAD Neg Hx        neg hx    Social History Social History   Tobacco Use  . Smoking status: Never Smoker  . Smokeless tobacco: Never Used  Substance Use Topics  . Alcohol use: No  . Drug use: No     Allergies   Patient has no known allergies.   Review of Systems Review of Systems  All other systems reviewed and are negative.    Physical Exam Triage Vital Signs ED Triage Vitals  Enc Vitals Group     BP 09/20/19 1239 133/88      Pulse Rate 09/20/19 1239 60     Resp --      Temp 09/20/19 1239 97.8 F (36.6 C)     Temp Source 09/20/19 1239 Oral     SpO2 09/20/19 1239 96 %     Weight 09/20/19 1237 168 lb (76.2 kg)     Height --      Head Circumference --      Peak Flow --      Pain Score 09/20/19 1237 2     Pain Loc --      Pain Edu? --      Excl. in [place]? --    No data found.  Updated Vital Signs BP 133/88 (BP Location: Right Arm)   Pulse 60   Temp 97.8 F (36.6 C) (Oral)   Wt 76.2 kg   SpO2 96%   BMI 22.78 kg/m   Visual Acuity Right Eye Distance:   Left Eye Distance:   Bilateral Distance:    Right Eye Near:   Left Eye Near:    Bilateral Near:     Physical Exam Vitals signs and nursing note reviewed.  Constitutional:      Appearance: He is well-developed.  HENT:     Head: Normocephalic and atraumatic.     Mouth/Throat:     Mouth: Mucous membranes are moist.  Eyes:     Conjunctiva/sclera: Conjunctivae normal.  Neck:     Musculoskeletal: Normal range of motion and neck supple.  Cardiovascular:     Rate and Rhythm: Normal rate and regular rhythm.     Heart sounds: No murmur.  Pulmonary:     Effort: Pulmonary effort is normal. No respiratory distress.     Breath sounds: Normal breath sounds.  Abdominal:     Palpations: Abdomen is soft.     Tenderness: There is no abdominal tenderness.  Skin:    General: Skin is warm and dry.  Neurological:     General: No focal deficit present.     Mental Status: He is alert.  Psychiatric:        Mood and Affect: Mood normal.      UC Treatments / Results  Labs (all labs ordered are listed, but only abnormal results are displayed) Labs Reviewed - No data to display  EKG   Radiology Cxr Pa/lat  Result Date: 09/20/2019 CLINICAL DATA:  Chest pain for 1 week. EXAM: CHEST - 2 VIEW COMPARISON:  09/11/2015 chest radiograph FINDINGS: The cardiomediastinal silhouette is unremarkable. There is no evidence of focal airspace disease, pulmonary edema,  suspicious pulmonary nodule/mass, pleural effusion, or pneumothorax. No acute bony abnormalities are identified. IMPRESSION: No active cardiopulmonary disease. Electronically Signed   By: Margarette Canada M.D.   On: 09/20/2019 13:54    Procedures Procedures (including critical care time)  Medications Ordered in UC Medications - No data to display  Initial Impression / Assessment and Plan / UC Course  I have reviewed the triage vital signs and the nursing notes.  Pertinent labs & imaging results that were available during my care of the patient were reviewed by me and considered in my medical decision making (see chart for details).     MDM: EKG shows sinus bradycardia at 54.  Pattern suspicious for WPW.  Reviewed the EKG with Dr. Sheppard Coil who is patient's primary care doctor.  I spoke to Dr. Debara Pickett who is on-call for cardiology he reviewed the EKG he does not feel the patient has any acute ST changes or anything that would be significant for acute MI.  He will have his practice contact patient to schedule an appointment for further evaluation.  I discussed this with patient he is in agreement with plan.  I suspect patient also has acute sinusitis I will treat him with Augmentin.  Patient is advised if symptoms become worse or change he should go to the [facility] emergency department for further evaluation Final Clinical Impressions(s) / UC Diagnoses   Final diagnoses:  Chest pain, unspecified type  Acute ethmoidal sinusitis, recurrence not specified     Discharge Instructions     See your Cardiologist for recheck.  Cardiology will call you to set up an appointment time.   Go to the Emergency Department if symptoms worsen or cahnge   ED Prescriptions    Medication Sig Dispense Auth. Provider   amoxicillin-clavulanate (AUGMENTIN) 875-125 MG tablet Take 1 tablet by mouth every 12 (twelve) hours for 10 days. 20 tablet Fransico Meadow, [place]     PDMP not reviewed this encounter.   Fransico Meadow, [place] 09/20/19 [phone]

## 2019-09-23 ENCOUNTER — Ambulatory Visit: Payer: BC Managed Care – PPO | Admitting: Internal Medicine

## 2019-09-23 ENCOUNTER — Encounter: Payer: Self-pay | Admitting: Internal Medicine

## 2019-09-23 ENCOUNTER — Other Ambulatory Visit: Payer: Self-pay

## 2019-09-23 VITALS — BP 114/70 | HR 56 | Ht 72.0 in | Wt 166.0 lb

## 2019-09-23 DIAGNOSIS — R002 Palpitations: Secondary | ICD-10-CM

## 2019-09-23 MED ORDER — PANTOPRAZOLE SODIUM 40 MG PO TBEC: 40.0000 mg | DELAYED_RELEASE_TABLET | Freq: Every day | ORAL | 2 refills | Status: DC

## 2019-09-23 NOTE — Patient Instructions (Addendum)
Medication Instructions:  Your physician has recommended you make the following change in your medication:   1.  Protonix 40 mg-  Take one tablet by mouth daily.  Labwork: None ordered.  Testing/Procedures: None ordered.  Follow-Up: Your physician wants you to follow-up in: as needed with Dr. Lovena Le.    Any Other Special Instructions Will Be Listed Below (If Applicable).  If you need a refill on your cardiac medications before your next appointment, please call your pharmacy.

## 2019-09-23 NOTE — Progress Notes (Signed)
HPI [name] returns today for followup of SVT. He is a pleasant 58 yo man with symptomatic WPW syndrome who was found at EP study to have a concealed left posterior AP and inducible SVT. He underwent successful ablation via the retrograde approach. He has had minimal palpitations but was noted on routing ECG to have intermittent pre-excitation. He also notes non-exertional chest pain, described as a burning sensation which will wake him from sleep. He denies any sustained heart racing.  No Known Allergies   Current Outpatient Medications  Medication Sig Dispense Refill  . amoxicillin-clavulanate (AUGMENTIN) 875-125 MG tablet Take 1 tablet by mouth every 12 (twelve) hours for 10 days. 20 tablet 0  . Multiple Vitamin (MULTIVITAMIN WITH MINERALS) TABS tablet Take 1 tablet by mouth daily.    . propranolol (INDERAL) 10 MG tablet Take 1 tablet (10 mg total) by mouth 4 (four) times daily as needed (palpitations). 30 tablet 3  . tamsulosin (FLOMAX) 0.4 MG CAPS capsule Take 1 capsule (0.4 mg total) by mouth daily. 90 capsule 3  . pantoprazole (PROTONIX) 40 MG tablet Take 1 tablet (40 mg total) by mouth daily. 30 tablet 2   No current facility-administered medications for this visit.     Past Medical History:  Diagnosis Date  . Bilateral inguinal hernia without obstruction or gangrene 04/25/2014  . Complication of anesthesia    trouble urinating after eye surgery 06  . Family history of prostate cancer in father 04/13/2019  . History of cardiac radiofrequency ablation 04/13/2019  . Inguinal hernia    bilateral  . OSA (obstructive sleep apnea) 09/12/2015  . Rapid palpitations    a. Tachypalps, frog positive 2014 - suspected AVNRT but not documented. b. Recurrent palps 08/2015, again ceased before they could be recorded.  . SVT (supraventricular tachycardia) ([place]) 12/14/2017    ROS:   All systems reviewed and negative except as noted in the HPI.   Past Surgical History:  Procedure  Laterality Date  . INGUINAL HERNIA REPAIR Bilateral 06/08/2014   Procedure: LAPAROSCOPIC BILATERAL INGUINAL HERNIA REPAIR ;  Surgeon: Imogene Burn. Georgette Dover, MD;  Location: [place];  Service: General;  Laterality: Bilateral;  . INSERTION OF MESH Bilateral 06/08/2014   Procedure: INSERTION OF MESH;  Surgeon: Imogene Burn. Georgette Dover, MD;  Location: [place];  Service: General;  Laterality: Bilateral;  . RETINAL DETACHMENT REPAIR W/ SCLERAL BUCKLE LE Right 06  . SVT ABLATION N/A 12/14/2017   Procedure: SVT ABLATION;  Surgeon: Evans Lance, MD;  Location: [place] [facility];  Service: Cardiovascular;  Laterality: N/A;  . VASECTOMY  2011     Family History  Problem Relation Age of Onset  . Skin cancer Mother   . High blood pressure Father   . CAD Neg Hx        neg hx     Social History   Socioeconomic History  . Marital status: Married    Spouse name: Not on file  . Number of children: Not on file  . Years of education: Not on file  . Highest education level: Not on file  Occupational History  . Occupation: Ecologist: [employer]  Tobacco Use  . Smoking status: Never Smoker  . Smokeless tobacco: Never Used  Substance and Sexual Activity  . Alcohol use: No  . Drug use: No  . Sexual activity: Yes    Partners: Female    Birth control/protection: Other-see comments    Comment: Vasectomy  Other Topics Concern  . Not on file  Social History Narrative  . Not on file   Social Determinants of Health   Financial Resource Strain:   . Difficulty of Paying Living Expenses: Not on file  Food Insecurity:   . Worried About Charity fundraiser in the Last Year: Not on file  . Ran Out of Food in the Last Year: Not on file  Transportation Needs:   . Lack of Transportation (Medical): Not on file  . Lack of Transportation (Non-Medical): Not on file  Physical Activity:   . Days of Exercise per Week: Not on file  . Minutes of Exercise per Session: Not on file  Stress:   . Feeling  of Stress : Not on file  Social Connections:   . Frequency of Communication with Friends and Family: Not on file  . Frequency of Social Gatherings with Friends and Family: Not on file  . Attends Religious Services: Not on file  . Active Member of Clubs or Organizations: Not on file  . Attends Archivist Meetings: Not on file  . Marital Status: Not on file  Intimate Partner Violence:   . Fear of Current or Ex-Partner: Not on file  . Emotionally Abused: Not on file  . Physically Abused: Not on file  . Sexually Abused: Not on file     BP 114/70   Pulse (!) 56   Ht 6' (1.829 m)   Wt 166 lb (75.3 kg)   SpO2 97%   BMI 22.51 kg/m   Physical Exam:  Well appearing NAD HEENT: Unremarkable Neck:  No JVD, no thyromegally Lymphatics:  No adenopathy Back:  No CVA tenderness Lungs:  Clear with no wheezes HEART:  Regular rate rhythm, no murmurs, no rubs, no clicks Abd:  soft, positive bowel sounds, no organomegally, no rebound, no guarding Ext:  2 plus pulses, no edema, no cyanosis, no clubbing Skin:  No rashes no nodules Neuro:  CN II through XII intact, motor grossly intact  EKG - nsr witnh no pre-excitation   Assess/Plan: 1. SVT - he has had no recurrent sustained symptoms. He has intermittent pre-excitation. He will undergo watchful waiting. 2. Chest pain - I discussed the possible etiologies. I think he has reflux and we will emperically start him on protonix. 3. WPW - he has had at times intermittent pre-excitation. I discussed the benign nature. He will undergo watchful waiting. At 57, it is likely that his WPW will go away over time completely.  Blake Sims.D.

## 2019-09-26 DIAGNOSIS — L820 Inflamed seborrheic keratosis: Secondary | ICD-10-CM | POA: Diagnosis not present

## 2019-09-26 NOTE — Addendum Note (Signed)
Addended by: Rose Phi on: 09/26/2019 06:22 PM   Modules accepted: Orders

## 2019-11-01 ENCOUNTER — Encounter: Payer: Self-pay | Admitting: Cardiovascular Disease

## 2019-11-01 ENCOUNTER — Ambulatory Visit: Payer: BC Managed Care – PPO | Admitting: Cardiovascular Disease

## 2019-11-01 ENCOUNTER — Other Ambulatory Visit: Payer: Self-pay

## 2019-11-01 VITALS — BP 112/72 | HR 58 | Ht 72.0 in | Wt 167.8 lb

## 2019-11-01 DIAGNOSIS — I471 Supraventricular tachycardia: Secondary | ICD-10-CM

## 2019-11-01 NOTE — Patient Instructions (Signed)
Medication Instructions:  Your physician recommends that you continue on your current medications as directed. Please refer to the Current Medication list given to you today.  *If you need a refill on your cardiac medications before your next appointment, please call your pharmacy*   Lab Work: None Ordered If you have labs (blood work) drawn today and your tests are completely normal, you will receive your results only by: . MyChart Message (if you have MyChart) OR . A paper copy in the mail If you have any lab test that is abnormal or we need to change your treatment, we will call you to review the results.   Testing/Procedures: None Ordered   Follow-Up: At [facility], you and your health needs are our priority.  As part of our continuing mission to provide you with exceptional heart care, we have created designated Provider Care Teams.  These Care Teams include your primary Cardiologist (physician) and Advanced Practice Providers (APPs -  Physician Assistants and Nurse Practitioners) who all work together to provide you with the care you need, when you need it.   Your next appointment:    As Needed  The format for your next appointment:   Either In Person or Virtual  Provider:   You may see Philip Nahser, MD or one of the following Advanced Practice Providers on your designated Care Team:    Scott Weaver, PA-C  Vin Bhagat, PA-C  Janine Hammond, NP     

## 2019-11-01 NOTE — Progress Notes (Signed)
Cardiology Office Note:    Date:  11/01/2019   ID:  Blake Sims, DOB 10-24-1960, MRN [id number]  PCP:  Emeterio Reeve, DO  Cardiologist:    Slade Pierpoint  Electrophysiologist:  Lovena Le   Referring MD: Emeterio Reeve, DO   No chief complaint on file.   Oct. 13, 2020    Blake Sims is a 59 y.o. male with a hx of SVT - s/p ablation by Dr. Lovena Le - March 2019.   He now has had several episodes of a rapid, regular episode  Might last for 30 seconds - 1 minute or so  He terminates with a cough  He performed carotid sinus massage and immediately corrected the SVT.  these paplitations returned several months ago Originally thougtht they were caffiene related.  Has largely cut out his caffeine intake.  He drinks decaffeinated coffee and only drinks 1 or 2 cups a day.  He avoids chocolate.  Also has GERD so he avoids peppermint, caffiene.   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     November 01, 2019:  Blake Sims is seen today for follow-up of his supraventricular tachycardia. He is done well.  Is not had any episodes of tachycardia.  He wore an event monitor but it did not show any episodes of SVT.  It showed some very nonspecific and insignificant episodes of nonsustained ventricular tachycardia. Is very active.  Doing all that he needs to do .  Has not had to take any inderal. No significant episodes since his ablation    Past Medical History:  Diagnosis Date  . Bilateral inguinal hernia without obstruction or gangrene 04/25/2014  . Complication of anesthesia    trouble urinating after eye surgery 06  . Family history of prostate cancer in father 04/13/2019  . History of cardiac radiofrequency ablation 04/13/2019  . Inguinal hernia    bilateral  . OSA (obstructive sleep apnea) 09/12/2015  . Rapid palpitations    a. Tachypalps, frog positive 2014 - suspected AVNRT but not documented. b. Recurrent  palps 08/2015, again ceased before they could be recorded.  . SVT (supraventricular tachycardia) ([place]) 12/14/2017    Past Surgical History:  Procedure Laterality Date  . INGUINAL HERNIA REPAIR Bilateral 06/08/2014   Procedure: LAPAROSCOPIC BILATERAL INGUINAL HERNIA REPAIR ;  Surgeon: Imogene Burn. Georgette Dover, MD;  Location: [facility];  Service: General;  Laterality: Bilateral;  . INSERTION OF MESH Bilateral 06/08/2014   Procedure: INSERTION OF MESH;  Surgeon: Imogene Burn. Georgette Dover, MD;  Location: [facility];  Service: General;  Laterality: Bilateral;  . RETINAL DETACHMENT REPAIR W/ SCLERAL BUCKLE LE Right 06  . SVT ABLATION N/A 12/14/2017   Procedure: SVT ABLATION;  Surgeon: Evans Lance, MD;  Location: [facility];  Service: Cardiovascular;  Laterality: N/A;  . VASECTOMY  2011    Current Medications: Current Meds  Medication Sig  . Multiple Vitamin (MULTIVITAMIN WITH MINERALS) TABS tablet Take 1 tablet by mouth daily.  . pantoprazole (PROTONIX) 40 MG tablet Take 1 tablet (40 mg total) by mouth daily.  . propranolol (INDERAL) 10 MG tablet Take 1 tablet (10 mg total) by mouth 4 (four) times daily as needed (palpitations).  . tamsulosin (FLOMAX) 0.4 MG CAPS capsule Take 1 capsule (0.4 mg total) by mouth daily.     Allergies:   Patient has no known allergies.   Social History   Socioeconomic History  . Marital status:  Married    Spouse name: Not on file  . Number of children: Not on file  . Years of education: Not on file  . Highest education level: Not on file  Occupational History  . Occupation: Ecologist: [employer]  Tobacco Use  . Smoking status: Never Smoker  . Smokeless tobacco: Never Used  Substance and Sexual Activity  . Alcohol use: No  . Drug use: No  . Sexual activity: Yes    Partners: Female    Birth control/protection: Other-see comments    Comment: Vasectomy  Other Topics Concern  . Not on file  Social History Narrative  . Not on file   Social  Determinants of Health   Financial Resource Strain:   . Difficulty of Paying Living Expenses: Not on file  Food Insecurity:   . Worried About Charity fundraiser in the Last Year: Not on file  . Ran Out of Food in the Last Year: Not on file  Transportation Needs:   . Lack of Transportation (Medical): Not on file  . Lack of Transportation (Non-Medical): Not on file  Physical Activity:   . Days of Exercise per Week: Not on file  . Minutes of Exercise per Session: Not on file  Stress:   . Feeling of Stress : Not on file  Social Connections:   . Frequency of Communication with Friends and Family: Not on file  . Frequency of Social Gatherings with Friends and Family: Not on file  . Attends Religious Services: Not on file  . Active Member of Clubs or Organizations: Not on file  . Attends Archivist Meetings: Not on file  . Marital Status: Not on file     Family History: The patient's family history includes High blood pressure in his father; Skin cancer in his mother. There is no history of CAD.  ROS:   Please see the history of present illness.     All other systems reviewed and are negative.  EKGs/Labs/Other Studies Reviewed:    The following studies were reviewed today:   EKG:      Recent Labs: 04/13/2019: ALT 25; BUN 14; Creat 0.94; Hemoglobin 15.0; Platelets 203; Potassium 4.8; Sodium 139 07/26/2019: TSH 0.676  Recent Lipid Panel    Component Value Date/Time   CHOL 175 04/13/2019 1146   TRIG 86 04/13/2019 1146   HDL 62 04/13/2019 1146   CHOLHDL 2.8 04/13/2019 1146   VLDL 18 09/12/2015 0120   LDLCALC 95 04/13/2019 1146    Physical Exam:    Physical Exam: Blood pressure 112/72, pulse (!) 58, height 6' (1.829 m), weight 167 lb 12.8 oz (76.1 kg), SpO2 99 %.  GEN:  Well nourished, well developed in no acute distress HEENT: Normal NECK: No JVD; No carotid bruits LYMPHATICS: No lymphadenopathy CARDIAC: RRR , no murmurs, rubs, gallops RESPIRATORY:  Clear  to auscultation without rales, wheezing or rhonchi  ABDOMEN: Soft, non-tender, non-distended MUSCULOSKELETAL:  No edema; No deformity  SKIN: Warm and dry NEUROLOGIC:  Alert and oriented x 3    ASSESSMENT:    No diagnosis found. PLAN:    In order of problems listed above:  Supraventricular tachycardia:   K[place]ain is done well.  Is not had any episodes of SVT.  At this point we will see him on an as-needed basis.  He is aware of the Valsalva maneuver if he needs it.  He does have some Inderal tablets left over.  Ive  advised him to  call us if he has any recurrent episodes of SVT.  Medication Adjustments/Labs and Tests Ordered: Current medicines are reviewed at length with the patient today.  Concerns regarding medicines are outlined above.  No orders of the defined types were placed in this encounter.  No orders of the defined types were placed in this encounter.   Patient Instructions  Medication Instructions:  Your physician recommends that you continue on your current medications as directed. Please refer to the Current Medication list given to you today.  *If you need a refill on your cardiac medications before your next appointment, please call your pharmacy*  Lab Work: None Ordered If you have labs (blood work) drawn today and your tests are completely normal, you will receive your results only by: Marland Kitchen MyChart Message (if you have MyChart) OR . A paper copy in the mail If you have any lab test that is abnormal or we need to change your treatment, we will call you to review the results.   Testing/Procedures: None Ordered   Follow-Up: At [facility], you and your health needs are our priority.  As part of our continuing mission to provide you with exceptional heart care, we have created designated Provider Care Teams.  These Care Teams include your primary Cardiologist (physician) and Advanced Practice Providers (APPs -  Physician Assistants and Nurse Practitioners) who  all work together to provide you with the care you need, when you need it.  Your next appointment:    As Needed  The format for your next appointment:   Either In Person or Virtual  Provider:   You may see Mertie Moores, MD or one of the following Advanced Practice Providers on your designated Care Team:    Richardson Dopp, PA-C  Vin [place], [place]  Daune Perch, [place]       Signed, Mertie Moores, MD  11/01/2019 8:46 AM    [place]

## 2020-02-23 ENCOUNTER — Encounter: Payer: Self-pay | Admitting: Osteopathic Medicine

## 2020-02-23 DIAGNOSIS — H66003 Acute suppurative otitis media without spontaneous rupture of ear drum, bilateral: Secondary | ICD-10-CM | POA: Diagnosis not present

## 2020-02-23 DIAGNOSIS — Z20828 Contact with and (suspected) exposure to other viral communicable diseases: Secondary | ICD-10-CM | POA: Diagnosis not present

## 2020-02-28 ENCOUNTER — Other Ambulatory Visit: Payer: Self-pay

## 2020-02-28 ENCOUNTER — Encounter: Payer: Self-pay | Admitting: Osteopathic Medicine

## 2020-02-28 ENCOUNTER — Telehealth (INDEPENDENT_AMBULATORY_CARE_PROVIDER_SITE_OTHER): Payer: BC Managed Care – PPO | Admitting: Osteopathic Medicine

## 2020-02-28 VITALS — Temp 100.3°F | Wt 165.0 lb

## 2020-02-28 DIAGNOSIS — J329 Chronic sinusitis, unspecified: Secondary | ICD-10-CM | POA: Diagnosis not present

## 2020-02-28 MED ORDER — IPRATROPIUM BROMIDE 0.06 % NA SOLN: 2.0000 | Freq: Four times a day (QID) | NASAL | 12 refills | Status: DC

## 2020-02-28 MED ORDER — PREDNISONE 10 MG PO TABS: 10.0000 mg | ORAL_TABLET | Freq: Two times a day (BID) | ORAL | 0 refills | Status: DC

## 2020-02-28 MED ORDER — AMOXICILLIN-POT CLAVULANATE 875-125 MG PO TABS: 1.0000 | ORAL_TABLET | Freq: Two times a day (BID) | ORAL | 0 refills | Status: DC

## 2020-02-28 NOTE — Progress Notes (Signed)
Virtual Visit via Video (App used: Doximity) Note  I connected with      Blake Sims on 02/28/20 at 1:38 PM  by a telemedicine application and verified that I am speaking with the correct person using two identifiers.  Patient is at home  I am in office   I discussed the limitations of evaluation and management by telemedicine and the availability of in person appointments. The patient expressed understanding and agreed to proceed.  History of Present Illness: Blake Sims is a 59 y.o. male who would like to discuss sinus problem - UC visit, pt reports (-) flu swab, was Rx Z-pack, pt had no relief, experiencing headaches, tooth/ear aches. Other home remedies - Neti pot, NyQuil    Immunization History  Administered Date(s) Administered  . Influenza,inj,Quad PF,6+ Mos 09/16/2018, 08/14/2019  . Influenza-Unspecified 09/17/2017  . Td 03/02/2007, 03/04/2017  . Tdap 03/02/2007, 03/04/2017  . Zoster Recombinat (Shingrix) 07/14/2012    Observations/Objective: Temp 100.3 F (37.9 C) (Oral)   Wt 165 lb (74.8 kg)   BMI 22.38 kg/m  BP Readings from Last 3 Encounters:  11/01/19 112/72  09/23/19 114/70  09/20/19 133/88   Exam: Normal Speech.  NAD  Lab and Radiology Results No results found for this or any previous visit (from the past 72 hour(s)). No results found.     Assessment and Plan: 59 y.o. male with The encounter diagnosis was Sinusitis, unspecified chronicity, unspecified location.   PDMP not reviewed this encounter. No orders of the defined types were placed in this encounter.  Meds ordered this encounter  Medications  . amoxicillin-clavulanate (AUGMENTIN) 875-125 MG tablet    Sig: Take 1 tablet by mouth 2 (two) times daily for 7 days.    Dispense:  14 tablet    Refill:  0  . predniSONE (DELTASONE) 10 MG tablet    Sig: Take 1 tablet (10 mg total) by mouth 2 (two) times daily with a meal.    Dispense:  20 tablet    Refill:  0  .  ipratropium (ATROVENT) 0.06 % nasal spray    Sig: Place 2 sprays into both nostrils 4 (four) times daily.    Dispense:  15 mL    Refill:  12   There are no Patient Instructions on file for this visit.  Instructions sent via MyChart. If MyChart not available, pt was given option for info via personal e-mail w/ no guarantee of protected health info over unsecured e-mail communication, and MyChart sign-up instructions were sent to patient.   Follow Up Instructions: Return if symptoms worsen or fail to improve.    I discussed the assessment and treatment plan with the patient. The patient was provided an opportunity to ask questions and all were answered. The patient agreed with the plan and demonstrated an understanding of the instructions.   The patient was advised to call back or seek an in-person evaluation if any new concerns, if symptoms worsen or if the condition fails to improve as anticipated.  30 minutes of non-face-to-face time was provided during this encounter.      . . . . . . . . . . . . . Marland Kitchen                   Historical information moved to improve visibility of documentation.  Past Medical History:  Diagnosis Date  . Bilateral inguinal hernia without obstruction or gangrene 04/25/2014  . Complication of anesthesia    trouble  urinating after eye surgery 06  . Family history of prostate cancer in father 04/13/2019  . History of cardiac radiofrequency ablation 04/13/2019  . Inguinal hernia    bilateral  . OSA (obstructive sleep apnea) 09/12/2015  . Rapid palpitations    a. Tachypalps, frog positive 2014 - suspected AVNRT but not documented. b. Recurrent palps 08/2015, again ceased before they could be recorded.  . SVT (supraventricular tachycardia) ([place]) 12/14/2017   Past Surgical History:  Procedure Laterality Date  . INGUINAL HERNIA REPAIR Bilateral 06/08/2014   Procedure: LAPAROSCOPIC BILATERAL INGUINAL HERNIA REPAIR ;  Surgeon: Imogene Burn.  Georgette Dover, MD;  Location: [facility];  Service: General;  Laterality: Bilateral;  . INSERTION OF MESH Bilateral 06/08/2014   Procedure: INSERTION OF MESH;  Surgeon: Imogene Burn. Georgette Dover, MD;  Location: [facility];  Service: General;  Laterality: Bilateral;  . RETINAL DETACHMENT REPAIR W/ SCLERAL BUCKLE LE Right 06  . SVT ABLATION N/A 12/14/2017   Procedure: SVT ABLATION;  Surgeon: Evans Lance, MD;  Location: [facility];  Service: Cardiovascular;  Laterality: N/A;  . VASECTOMY  2011   Social History   Tobacco Use  . Smoking status: Never Smoker  . Smokeless tobacco: Never Used  Substance Use Topics  . Alcohol use: No   family history includes High blood pressure in his father; Skin cancer in his mother.  Medications: Current Outpatient Medications  Medication Sig Dispense Refill  . pantoprazole (PROTONIX) 40 MG tablet Take 1 tablet (40 mg total) by mouth daily. 30 tablet 2  . tamsulosin (FLOMAX) 0.4 MG CAPS capsule Take 1 capsule (0.4 mg total) by mouth daily. 90 capsule 3  . amoxicillin-clavulanate (AUGMENTIN) 875-125 MG tablet Take 1 tablet by mouth 2 (two) times daily for 7 days. 14 tablet 0  . ipratropium (ATROVENT) 0.06 % nasal spray Place 2 sprays into both nostrils 4 (four) times daily. 15 mL 12  . Multiple Vitamin (MULTIVITAMIN WITH MINERALS) TABS tablet Take 1 tablet by mouth daily.    . predniSONE (DELTASONE) 10 MG tablet Take 1 tablet (10 mg total) by mouth 2 (two) times daily with a meal. 20 tablet 0  . propranolol (INDERAL) 10 MG tablet Take 1 tablet (10 mg total) by mouth 4 (four) times daily as needed (palpitations). (Patient not taking: Reported on 02/28/2020) 30 tablet 3   No current facility-administered medications for this visit.   No Known Allergies

## 2020-02-28 NOTE — Patient Instructions (Signed)
Over-the-Counter Medications & Home Remedies for Upper Respiratory Illness  Aches/Pains, Fever, Headache Acetaminophen (Tylenol) 500 mg tablets - take max 2 tablets (1000 mg) every 6 hours (4 times per day)  Ibuprofen (Motrin) 200 mg tablets - take max 4 tablets (800 mg) every 6 hours  Sinus Congestion Prescription Atrovent as directed Nasal Saline if desired Oxymetolazone (Afrin, others) sparing use due to rebound congestion, NEVER use in kids Phenylephrine (Sudafed) 10 mg tablets every 4 hours (or the 12-hour formulation) Diphenhydramine (Benadryl) 25 mg tablets - take max 2 tablets every 4 hours  Cough & Sore Throat Dextromethorphan (Robitussin, others) - cough suppressant Guaifenesin (Robitussin, Mucinex, others) - expectorant (helps cough up mucus) (Dextromethorphan and Guaifenesin also come in a combination tablet) Lozenges w/ Benzocaine + Menthol (Cepacol) Honey - as much as you want! Teas which "coat the throat" - look for ingredients Elm Bark, Licorice Root, Marshmallow Root  Other Antibiotics if these are prescribed - take ALL, even if you're feeling better  Zinc Lozenges within 24 hours of symptoms onset - mixed evidence this shortens the duration of the common cold Don't waste your money on Vitamin C or Echinacea

## 2020-02-28 NOTE — Telephone Encounter (Signed)
Appt was made by Helyn Numbers ([place]) and was completed today.

## 2020-03-01 MED ORDER — DOXYCYCLINE HYCLATE 100 MG PO TABS: 100.0000 mg | ORAL_TABLET | Freq: Two times a day (BID) | ORAL | 0 refills | Status: DC

## 2020-03-01 NOTE — Addendum Note (Signed)
Addended by: Maryla Morrow on: 03/01/2020 12:51 PM   Modules accepted: Orders

## 2020-03-09 ENCOUNTER — Encounter: Payer: Self-pay | Admitting: Osteopathic Medicine

## 2020-03-14 ENCOUNTER — Other Ambulatory Visit: Payer: Self-pay

## 2020-03-14 ENCOUNTER — Ambulatory Visit: Payer: BC Managed Care – PPO | Admitting: Family Medicine

## 2020-03-14 ENCOUNTER — Encounter: Payer: Self-pay | Admitting: Family Medicine

## 2020-03-14 VITALS — BP 114/64 | HR 60 | Temp 97.7°F | Ht 72.05 in | Wt 170.2 lb

## 2020-03-14 DIAGNOSIS — J329 Chronic sinusitis, unspecified: Secondary | ICD-10-CM | POA: Insufficient documentation

## 2020-03-14 DIAGNOSIS — H66006 Acute suppurative otitis media without spontaneous rupture of ear drum, recurrent, bilateral: Secondary | ICD-10-CM | POA: Diagnosis not present

## 2020-03-14 MED ORDER — LEVOFLOXACIN 500 MG PO TABS: 500.0000 mg | ORAL_TABLET | Freq: Every day | ORAL | 0 refills | Status: DC

## 2020-03-14 NOTE — Progress Notes (Signed)
Blake Sims - 59 y.o. male MRN [id number]  Date of birth: 05/04/1961  Subjective No chief complaint on file.   HPI Blake Sims is a 59 y.o. male here today with follow up of sinusitis.  Reports ongoing ear pain, tooth pain and sore throat.  He was seen at urgent care on 5/13 after flu exposure but swabbed negative for flu.  Started on azithromycin however did not have improvement.  He also completed course of amoxicillin without improvement.  He was sen by Dr. Sheppard Coil over video visit on 5/18 and started on doxycycline.  He did have improvement with sinus pain but continues to have bilateral ear pain, L sided tooth pain and sore throat. He denies any continued fever, chills, nausea, vomiting, diarrhea, cough or shortness of breath.   He did try course of prednisone as well but this didn't help either.   ROS:  A comprehensive ROS was completed and negative except as noted per HPI   No Known Allergies  Past Medical History:  Diagnosis Date  . Bilateral inguinal hernia without obstruction or gangrene 04/25/2014  . Complication of anesthesia    trouble urinating after eye surgery 06  . Family history of prostate cancer in father 04/13/2019  . History of cardiac radiofrequency ablation 04/13/2019  . Inguinal hernia    bilateral  . OSA (obstructive sleep apnea) 09/12/2015  . Rapid palpitations    a. Tachypalps, frog positive 2014 - suspected AVNRT but not documented. b. Recurrent palps 08/2015, again ceased before they could be recorded.  . SVT (supraventricular tachycardia) ([place]) 12/14/2017    Past Surgical History:  Procedure Laterality Date  . INGUINAL HERNIA REPAIR Bilateral 06/08/2014   Procedure: LAPAROSCOPIC BILATERAL INGUINAL HERNIA REPAIR ;  Surgeon: Imogene Burn. Georgette Dover, MD;  Location: [facility];  Service: General;  Laterality: Bilateral;  . INSERTION OF MESH Bilateral 06/08/2014   Procedure: INSERTION OF MESH;  Surgeon: Imogene Burn. Georgette Dover, MD;  Location: [facility];  Service: General;   Laterality: Bilateral;  . RETINAL DETACHMENT REPAIR W/ SCLERAL BUCKLE LE Right 06  . SVT ABLATION N/A 12/14/2017   Procedure: SVT ABLATION;  Surgeon: Evans Lance, MD;  Location: [facility];  Service: Cardiovascular;  Laterality: N/A;  . VASECTOMY  2011    Social History   Socioeconomic History  . Marital status: Married    Spouse name: Not on file  . Number of children: Not on file  . Years of education: Not on file  . Highest education level: Not on file  Occupational History  . Occupation: Ecologist: [employer]  Tobacco Use  . Smoking status: Never Smoker  . Smokeless tobacco: Never Used  Substance and Sexual Activity  . Alcohol use: No  . Drug use: No  . Sexual activity: Yes    Partners: Female    Birth control/protection: Other-see comments    Comment: Vasectomy  Other Topics Concern  . Not on file  Social History Narrative  . Not on file   Social Determinants of Health   Financial Resource Strain:   . Difficulty of Paying Living Expenses:   Food Insecurity:   . Worried About Charity fundraiser in the Last Year:   . Arboriculturist in the Last Year:   Transportation Needs:   . Film/video editor (Medical):   Marland Kitchen Lack of Transportation (Non-Medical):   Physical Activity:   . Days of Exercise per Week:   . Minutes of  Exercise per Session:   Stress:   . Feeling of Stress :   Social Connections:   . Frequency of Communication with Friends and Family:   . Frequency of Social Gatherings with Friends and Family:   . Attends Religious Services:   . Active Member of Clubs or Organizations:   . Attends Archivist Meetings:   Marland Kitchen Marital Status:     Family History  Problem Relation Age of Onset  . Skin cancer Mother   . High blood pressure Father   . CAD Neg Hx        neg hx    Health Maintenance  Topic Date Due  . Hepatitis C Screening  Never done  . HIV Screening  Never done  . COLONOSCOPY  03/13/2020  .  INFLUENZA VACCINE  05/13/2020  . TETANUS/TDAP  03/05/2027     ----------------------------------------------------------------------------------------------------------------------------------------------------------------------------------------------------------------- Physical Exam BP 114/64 (BP Location: Left Arm, Patient Position: Sitting, Cuff Size: Normal)   Pulse 60   Temp 97.7 F (36.5 C)   Ht 6' 0.05" (1.83 m)   Wt 170 lb 3.2 oz (77.2 kg)   SpO2 100%   BMI 23.05 kg/m   Physical Exam Constitutional:      Appearance: Normal appearance.  HENT:     Head: Normocephalic and atraumatic.     Ears:     Comments: Erythema and bulging of TM's bilaterally.     Nose:     Comments: Mild ttp over maxillary sinus on L    Mouth/Throat:     Mouth: Mucous membranes are moist.     Pharynx: No oropharyngeal exudate.  Eyes:     General: No scleral icterus. Cardiovascular:     Rate and Rhythm: Normal rate and regular rhythm.  Pulmonary:     Effort: Pulmonary effort is normal.     Breath sounds: Normal breath sounds.  Musculoskeletal:     Cervical back: Neck supple.  Lymphadenopathy:     Cervical: Cervical adenopathy present.  Neurological:     General: No focal deficit present.     Mental Status: He is alert and oriented to person, place, and time.  Psychiatric:        Mood and Affect: Mood normal.        Behavior: Behavior normal.     ------------------------------------------------------------------------------------------------------------------------------------------------------------------------------------------------------------------- Assessment and Plan  Recurrent sinusitis Had some improvement with doxycycline but did not fully resolve.  Bilateral OM on exam as well.  Start levaquin as he has tried azithromycin, doxycyline and amoxicillin already.  Side effects of this medication reviewed.   Referral entered to ENT.     Meds ordered this encounter  Medications   . levofloxacin (LEVAQUIN) 500 MG tablet    Sig: Take 1 tablet (500 mg total) by mouth daily.    Dispense:  7 tablet    Refill:  0    No follow-ups on file.    This visit occurred during the SARS-CoV-2 public health emergency.  Safety protocols were in place, including screening questions prior to the visit, additional usage of staff PPE, and extensive cleaning of exam room while observing appropriate contact time as indicated for disinfecting solutions.

## 2020-03-14 NOTE — Progress Notes (Signed)
Patient fully vaccinated by [employer].  Request card information be forwarded to [place] for chart update.

## 2020-03-14 NOTE — Assessment & Plan Note (Signed)
Had some improvement with doxycycline but did not fully resolve.  Bilateral OM on exam as well.  Start levaquin as he has tried azithromycin, doxycyline and amoxicillin already.  Side effects of this medication reviewed.   Referral entered to ENT.

## 2020-03-14 NOTE — Patient Instructions (Signed)
I have sent over levaquin and placed ENT referral.  You should be contacted to set up ENT appt. Soon.

## 2020-03-19 ENCOUNTER — Telehealth: Payer: Self-pay

## 2020-03-19 NOTE — Telephone Encounter (Signed)
Pt lvm stating he had not heard from ENT referral.  Referral was sent with documentation on 03/15/20.  Provided Blake Sims with [facility] [phone] contact information for additional follow-up.

## 2020-03-28 DIAGNOSIS — B370 Candidal stomatitis: Secondary | ICD-10-CM | POA: Diagnosis not present

## 2020-03-28 DIAGNOSIS — H93293 Other abnormal auditory perceptions, bilateral: Secondary | ICD-10-CM | POA: Diagnosis not present

## 2020-03-28 DIAGNOSIS — H6983 Other specified disorders of Eustachian tube, bilateral: Secondary | ICD-10-CM | POA: Diagnosis not present

## 2020-03-28 DIAGNOSIS — J329 Chronic sinusitis, unspecified: Secondary | ICD-10-CM | POA: Diagnosis not present

## 2020-04-10 ENCOUNTER — Encounter: Payer: BC Managed Care – PPO | Admitting: Osteopathic Medicine

## 2020-04-17 ENCOUNTER — Other Ambulatory Visit: Payer: Self-pay

## 2020-04-17 ENCOUNTER — Encounter: Payer: Self-pay | Admitting: Osteopathic Medicine

## 2020-04-17 ENCOUNTER — Encounter: Payer: BC Managed Care – PPO | Admitting: Osteopathic Medicine

## 2020-04-17 ENCOUNTER — Ambulatory Visit (INDEPENDENT_AMBULATORY_CARE_PROVIDER_SITE_OTHER): Payer: BC Managed Care – PPO | Admitting: Osteopathic Medicine

## 2020-04-17 VITALS — BP 112/75 | HR 54 | Temp 97.0°F | Ht 72.0 in | Wt 173.0 lb

## 2020-04-17 DIAGNOSIS — Z0000 Encounter for general adult medical examination without abnormal findings: Secondary | ICD-10-CM

## 2020-04-17 DIAGNOSIS — Z1211 Encounter for screening for malignant neoplasm of colon: Secondary | ICD-10-CM | POA: Diagnosis not present

## 2020-04-17 NOTE — Patient Instructions (Addendum)
General Preventive Care  Most recent routine screening labs: ordered.   Blood pressure goal 130/80 or less.   Tobacco: don't!   Alcohol: responsible moderation is ok for most adults - if you have concerns about your alcohol intake, please talk to me!   Exercise: as tolerated to reduce risk of cardiovascular disease and diabetes. Strength training will also prevent osteoporosis.   Mental health: if need for mental health care (medicines, counseling, other), or concerns about moods, please let me know!   Sexual / Reproductive health: if need for STD testing, or if concerns with libido/pain problems, please let me know!   Advanced Directive: Living Will and/or Healthcare Power of Attorney recommended for all adults, regardless of age or health.  Vaccines  Flu vaccine: for almost everyone, every fall.   Shingles vaccine: after age 2.   Pneumonia vaccines: after age 72  Tetanus booster: every 10 years due 2028  COVID vaccine: THANKS for getting your vaccine! :)  Cancer screenings   Colon cancer screening: for everyone age 62-75. Colonoscopy available for all, many people also qualify for the Cologuard stool test   Prostate cancer screening: PSA blood test age 89-71  Lung cancer screening: not needed for non-smokers  Infection screenings  . HIV: recommended screening at least once age 37-65 . Gonorrhea/Chlamydia: screening as needed . Hepatitis C: recommended once for everyone age 21-75 . TB: certain at-risk populations, or depending on work requirements and/or travel history Other . Bone Density Test: recommended for men at age 79 . Abdominal Aortic Aneurysm: screening with ultrasound recommended once for men age 60-75 who have ever smoked

## 2020-04-17 NOTE — Progress Notes (Signed)
I personally was present and performed or re-performed the history, physical exam and medical decision-making activities of this service and have verified that the service and findings are accurately documented in the student's note. I have edited the note where appropriate. I have ensured all patient questions were answered at time of visit.

## 2020-04-17 NOTE — Progress Notes (Signed)
HPI: Blake Sims is a 59 y.o. male who  has a past medical history of Bilateral inguinal hernia without obstruction or gangrene (11/22/4707), Complication of anesthesia, Family history of prostate cancer in father (04/13/2019), History of cardiac radiofrequency ablation (04/13/2019), Inguinal hernia, OSA (obstructive sleep apnea) (09/12/2015), Rapid palpitations, and SVT (supraventricular tachycardia) ([place]) (12/14/2017).  he presents to [facility] today, 04/17/20,  for chief complaint of: Annual physical  Patient has been in clinic multiple times over the past several weeks for symptoms of sinusitis. He currently endorses a persistent headache localized to the left side of his head as well as mild bilateral ear pain. He has been treated with multiple courses of antibiotics and steroids without resolution and is scheduled to see ENT on 7/8. Otherwise patient denies any symptoms or concerns today.  Past medical, surgical, social and family history reviewed:  Patient Active Problem List   Diagnosis Date Noted  . Recurrent sinusitis 03/14/2020  . Recurrent acute suppurative otitis media without spontaneous rupture of tympanic membrane of both sides 03/14/2020  . History of cardiac radiofrequency ablation 04/13/2019  . Family history of prostate cancer in father 04/13/2019  . Benign prostatic hyperplasia without lower urinary tract symptoms 04/13/2019  . SVT (supraventricular tachycardia) ([place]) 12/14/2017  . OSA (obstructive sleep apnea) 09/12/2015  . Palpitations 02/23/2013    Past Surgical History:  Procedure Laterality Date  . INGUINAL HERNIA REPAIR Bilateral 06/08/2014   Procedure: LAPAROSCOPIC BILATERAL INGUINAL HERNIA REPAIR ;  Surgeon: Imogene Burn. Georgette Dover, MD;  Location: [place];  Service: General;  Laterality: Bilateral;  . INSERTION OF MESH Bilateral 06/08/2014   Procedure: INSERTION OF MESH;  Surgeon: Imogene Burn. Georgette Dover, MD;  Location: [place];  Service:  General;  Laterality: Bilateral;  . RETINAL DETACHMENT REPAIR W/ SCLERAL BUCKLE LE Right 06  . SVT ABLATION N/A 12/14/2017   Procedure: SVT ABLATION;  Surgeon: Evans Lance, MD;  Location: [place] [facility];  Service: Cardiovascular;  Laterality: N/A;  . VASECTOMY  2011    Social History   Tobacco Use  . Smoking status: Never Smoker  . Smokeless tobacco: Never Used  Substance Use Topics  . Alcohol use: No    Family History  Problem Relation Age of Onset  . Skin cancer Mother   . High blood pressure Father   . CAD Neg Hx        neg hx     Current medication list and allergy/intolerance information reviewed:    Current Outpatient Medications  Medication Sig Dispense Refill  . Multiple Vitamin (MULTIVITAMIN WITH MINERALS) TABS tablet Take 1 tablet by mouth daily.    . tamsulosin (FLOMAX) 0.4 MG CAPS capsule Take 1 capsule (0.4 mg total) by mouth daily. 90 capsule 3  . ipratropium (ATROVENT) 0.06 % nasal spray Place 2 sprays into both nostrils 4 (four) times daily. 15 mL 12  . levofloxacin (LEVAQUIN) 500 MG tablet Take 1 tablet (500 mg total) by mouth daily. 7 tablet 0  . pantoprazole (PROTONIX) 40 MG tablet Take 1 tablet (40 mg total) by mouth daily. 30 tablet 2  . predniSONE (DELTASONE) 10 MG tablet Take 1 tablet (10 mg total) by mouth 2 (two) times daily with a meal. 20 tablet 0  . propranolol (INDERAL) 10 MG tablet Take 1 tablet (10 mg total) by mouth 4 (four) times daily as needed (palpitations). 30 tablet 3   No current facility-administered medications for this visit.    No Known Allergies  Review of Systems:  Constitutional:  No  fever, no chills.   HEENT: Endorses  Headache and ear pain. no vision change, no hearing change, No sore throat  Cardiac: No  chest pain, palpitations  Respiratory:  No  shortness of breath. No  Cough  Gastrointestinal: No  abdominal pain, No  nausea, No  vomiting, No  diarrhea, No  constipation   Musculoskeletal: No new  myalgia/arthralgia  Skin: No  Rash, No other wounds/concerning lesions  Genitourinary: No  incontinence  Neurologic: No  weakness, No  dizziness, No  slurred speech/focal weakness/facial droop  Psychiatric: No  concerns with depression, No  concerns with anxiety, No sleep problems, No mood problems  Exam:  BP 112/75 (BP Location: Left Arm, Patient Position: Sitting)   Pulse (!) 54   Temp (!) 97 F (36.1 C)   Ht 6' (1.829 m)   Wt 173 lb (78.5 kg)   SpO2 100%   BMI 23.46 kg/m   Constitutional: VS see above. General Appearance: alert, well-developed, well-nourished, NAD  Eyes: Normal lids and conjunctive, non-icteric sclera  Ears, Nose, Mouth, Throat: MMM, Normal external inspection ears/nares/mouth/lips/gums. TM normal bilaterally.  Neck: No masses, trachea midline. No thyroid enlargement. No tenderness/mass appreciated. No lymphadenopathy  Respiratory: Normal respiratory effort. no wheeze, no rhonchi, no rales  Cardiovascular: S1/S2 normal, no murmur, no rub/gallop auscultated. RRR. No lower extremity edema.  Gastrointestinal: Nontender, no masses. No hepatomegaly, no splenomegaly.  Musculoskeletal: Gait normal. No clubbing/cyanosis of digits.   Neurological: Normal balance/coordination. No tremor. No cranial nerve deficit on limited exam.  Skin: warm, dry, intact. No rash/ulcer. No concerning nevi or subq nodules on limited exam.    Psychiatric: Normal judgment/insight. Normal mood and affect. Oriented x3.    No results found for this or any previous visit (from the past 72 hour(s)).  No results found.   ASSESSMENT/PLAN: The encounter diagnosis was Annual physical exam.   ENT follow up appointment on 7/8 for ongoing sinus issues.  GI referral placed for colonoscopy.  Patient had 1 dose shingles vaccine in 2013. Discussed option for 2 dose shingrix and patient is planning to schedule at a later time.  Routine labs ordered (CBC, CMP, lipid panel,  PSA).  Orders Placed This Encounter  Procedures  . CBC  . COMPLETE METABOLIC PANEL WITH GFR  . Lipid panel  . PSA, Total with Reflex to PSA, Free    No orders of the defined types were placed in this encounter.   Patient Instructions  General Preventive Care  Most recent routine screening labs: ordered.   Blood pressure goal 130/80 or less.   Tobacco: don't!   Alcohol: responsible moderation is ok for most adults - if you have concerns about your alcohol intake, please talk to me!   Exercise: as tolerated to reduce risk of cardiovascular disease and diabetes. Strength training will also prevent osteoporosis.   Mental health: if need for mental health care (medicines, counseling, other), or concerns about moods, please let me know!   Sexual / Reproductive health: if need for STD testing, or if concerns with libido/pain problems, please let me know!   Advanced Directive: Living Will and/or Healthcare Power of Attorney recommended for all adults, regardless of age or health.  Vaccines  Flu vaccine: for almost everyone, every fall.   Shingles vaccine: after age 28.   Pneumonia vaccines: after age 42  Tetanus booster: every 10 years due 2028  COVID vaccine: THANKS for getting your vaccine! :)  Cancer screenings  Colon cancer screening: for everyone age 20-75. Colonoscopy available for all, many people also qualify for the Cologuard stool test   Prostate cancer screening: PSA blood test age 27-71  Lung cancer screening: not needed for non-smokers  Infection screenings  . HIV: recommended screening at least once age 13-65 . Gonorrhea/Chlamydia: screening as needed . Hepatitis C: recommended once for everyone age 43-75 . TB: certain at-risk populations, or depending on work requirements and/or travel history Other . Bone Density Test: recommended for men at age 70 . Abdominal Aortic Aneurysm: screening with ultrasound recommended once for men age 84-75 who have ever  smoked        Visit summary with medication list and pertinent instructions was printed for patient to review. All questions at time of visit were answered - patient instructed to contact office with any additional concerns or updates. ER/RTC precautions were reviewed with the patient.     Please note: voice recognition software was used to produce this document, and typos may escape review. Please contact Dr. Sheppard Coil for any needed clarifications.     Follow-up plan: Return in about 1 year (around 04/17/2021) for [place] (call week prior to visit for lab orders).

## 2020-04-18 LAB — CBC
HCT: 42.7 % (ref 38.5–50.0)
Hemoglobin: 14.7 g/dL (ref 13.2–17.1)
MCH: 32.2 pg (ref 27.0–33.0)
MCHC: 34.4 g/dL (ref 32.0–36.0)
MCV: 93.4 fL (ref 80.0–100.0)
MPV: 8.9 fL (ref 7.5–12.5)
Platelets: 188 10*3/uL (ref 140–400)
RBC: 4.57 10*6/uL (ref 4.20–5.80)
RDW: 12.6 % (ref 11.0–15.0)
WBC: 5.3 10*3/uL (ref 3.8–10.8)

## 2020-04-18 LAB — COMPLETE METABOLIC PANEL WITH GFR
AG Ratio: 1.8 (calc) (ref 1.0–2.5)
ALT: 23 U/L (ref 9–46)
AST: 18 U/L (ref 10–35)
Albumin: 4.2 g/dL (ref 3.6–5.1)
Alkaline phosphatase (APISO): 77 U/L (ref 35–144)
BUN: 21 mg/dL (ref 7–25)
CO2: 31 mmol/L (ref 20–32)
Calcium: 9.3 mg/dL (ref 8.6–10.3)
Chloride: 107 mmol/L (ref 98–110)
Creat: 0.83 mg/dL (ref 0.70–1.25)
GFR, Est African American: 111 mL/min/{1.73_m2} (ref >=60)
GFR, Est Non African American: 96 mL/min/{1.73_m2} (ref >=60)
Globulin: 2.3 g/dL (calc) (ref 1.9–3.7)
Glucose, Bld: 86 mg/dL (ref 65–99)
Potassium: 4.9 mmol/L (ref 3.5–5.3)
Sodium: 142 mmol/L (ref 135–146)
Total Bilirubin: 0.4 mg/dL (ref 0.2–1.2)
Total Protein: 6.5 g/dL (ref 6.1–8.1)

## 2020-04-18 LAB — LIPID PANEL
Cholesterol: 185 mg/dL (ref <200)
HDL: 61 mg/dL (ref >=40)
LDL Cholesterol (Calc): 109 mg/dL (calc) — ABNORMAL HIGH
Non-HDL Cholesterol (Calc): 124 mg/dL (calc) (ref <130)
Total CHOL/HDL Ratio: 3 (calc) (ref <5.0)
Triglycerides: 67 mg/dL (ref <150)

## 2020-04-18 LAB — PSA, TOTAL WITH REFLEX TO PSA, FREE: PSA, Total: 2.5 ng/mL (ref <=4.0)

## 2020-04-19 ENCOUNTER — Encounter: Payer: Self-pay | Admitting: Gastroenterology

## 2020-04-19 DIAGNOSIS — H6983 Other specified disorders of Eustachian tube, bilateral: Secondary | ICD-10-CM | POA: Diagnosis not present

## 2020-04-19 DIAGNOSIS — B370 Candidal stomatitis: Secondary | ICD-10-CM | POA: Diagnosis not present

## 2020-06-01 ENCOUNTER — Encounter: Payer: Self-pay | Admitting: Gastroenterology

## 2020-06-01 ENCOUNTER — Ambulatory Visit (AMBULATORY_SURGERY_CENTER): Payer: Self-pay | Admitting: *Deleted

## 2020-06-01 ENCOUNTER — Other Ambulatory Visit: Payer: Self-pay

## 2020-06-01 VITALS — Ht 72.0 in | Wt 165.0 lb

## 2020-06-01 DIAGNOSIS — Z1211 Encounter for screening for malignant neoplasm of colon: Secondary | ICD-10-CM

## 2020-06-01 MED ORDER — SUTAB 1479-225-188 MG PO TABS: 24.0000 | ORAL_TABLET | ORAL | 0 refills | Status: DC

## 2020-06-01 NOTE — Progress Notes (Signed)

## 2020-06-09 ENCOUNTER — Other Ambulatory Visit: Payer: Self-pay | Admitting: Osteopathic Medicine

## 2020-06-15 ENCOUNTER — Encounter: Payer: BC Managed Care – PPO | Admitting: Gastroenterology

## 2020-06-29 ENCOUNTER — Encounter: Payer: Self-pay | Admitting: Gastroenterology

## 2020-06-29 ENCOUNTER — Other Ambulatory Visit: Payer: Self-pay

## 2020-06-29 ENCOUNTER — Ambulatory Visit (AMBULATORY_SURGERY_CENTER): Payer: BC Managed Care – PPO | Admitting: Gastroenterology

## 2020-06-29 VITALS — BP 106/73 | HR 67 | Temp 98.6°F | Resp 15 | Ht 72.0 in | Wt 165.0 lb

## 2020-06-29 DIAGNOSIS — Z1211 Encounter for screening for malignant neoplasm of colon: Secondary | ICD-10-CM | POA: Diagnosis not present

## 2020-06-29 DIAGNOSIS — D125 Benign neoplasm of sigmoid colon: Secondary | ICD-10-CM

## 2020-06-29 DIAGNOSIS — D122 Benign neoplasm of ascending colon: Secondary | ICD-10-CM | POA: Diagnosis not present

## 2020-06-29 DIAGNOSIS — K640 First degree hemorrhoids: Secondary | ICD-10-CM

## 2020-06-29 MED ORDER — SODIUM CHLORIDE 0.9 % IV SOLN: 500.0000 mL | Freq: Once | INTRAVENOUS | Status: DC

## 2020-06-29 NOTE — Patient Instructions (Signed)
Your prep was sub-optimal so you will need another colonoscopy in 6-12 months.  Read all of the handouts given to you by your recovery room nurse.  Thank-you for choosing [place] for your healthcare needs today.  YOU HAD AN ENDOSCOPIC PROCEDURE TODAY AT [facility]:   Refer to the procedure report that was given to you for any specific questions about what was found during the examination.  If the procedure report does not answer your questions, please call your gastroenterologist to clarify.  If you requested that your care partner not be given the details of your procedure findings, then the procedure report has been included in a sealed envelope for you to review at your convenience later.  YOU SHOULD EXPECT: Some feelings of bloating in the abdomen. Passage of more gas than usual.  Walking can help get rid of the air that was put into your GI tract during the procedure and reduce the bloating. If you had a lower endoscopy (such as a colonoscopy or flexible sigmoidoscopy) you may notice spotting of blood in your stool or on the toilet paper. If you underwent a bowel prep for your procedure, you may not have a normal bowel movement for a few days.  Please Note:  You might notice some irritation and congestion in your nose or some drainage.  This is from the oxygen used during your procedure.  There is no need for concern and it should clear up in a day or so.  SYMPTOMS TO REPORT IMMEDIATELY:   Following lower endoscopy (colonoscopy or flexible sigmoidoscopy):  Excessive amounts of blood in the stool  Significant tenderness or worsening of abdominal pains  Swelling of the abdomen that is new, acute  Fever of 100F or higher   For urgent or emergent issues, a gastroenterologist can be reached at any hour by calling [phone]. Do not use MyChart messaging for urgent concerns.    DIET:  We do recommend a small meal at first, but then you may proceed to your regular diet.  Drink  plenty of fluids but you should avoid alcoholic beverages for 24 hours.  ACTIVITY:  You should plan to take it easy for the rest of today and you should NOT DRIVE or use heavy machinery until tomorrow (because of the sedation medicines used during the test).    FOLLOW UP: Our staff will call the number listed on your records 48-72 hours following your procedure to check on you and address any questions or concerns that you may have regarding the information given to you following your procedure. If we do not reach you, we will leave a message.  We will attempt to reach you two times.  During this call, we will ask if you have developed any symptoms of COVID 19. If you develop any symptoms (ie: fever, flu-like symptoms, shortness of breath, cough etc.) before then, please call [phone].  If you test positive for Covid 19 in the 2 weeks post procedure, please call and report this information to [place].    If any biopsies were taken you will be contacted by phone or by letter within the next 1-3 weeks.  Please call us at [phone] if you have not heard about the biopsies in 3 weeks.    SIGNATURES/CONFIDENTIALITY: You and/or your care partner have signed paperwork which will be entered into your electronic medical record.  These signatures attest to the fact that that the information above on your After Visit Summary has been  reviewed and is understood.  Full responsibility of the confidentiality of this discharge information lies with you and/or your care-partner. 

## 2020-06-29 NOTE — Progress Notes (Signed)
Pt's states no medical or surgical changes since previsit or office visit. 

## 2020-06-29 NOTE — Progress Notes (Signed)
Called to room to assist during endoscopic procedure.  Patient ID and intended procedure confirmed with present staff. Received instructions for my participation in the procedure from the performing physician.  

## 2020-06-29 NOTE — Progress Notes (Signed)
A and O x3. Report to RN. Tolerated MAC anesthesia well.

## 2020-06-29 NOTE — Op Note (Signed)
[place] Patient Name: Blake Sims Procedure Date: 06/29/2020 7:57 AM MRN: [id number] Endoscopist: Gerrit Heck , MD Age: 59 Referring MD:  Date of Birth: [birth year] Gender: Male Account #: [id number] Procedure:                Colonoscopy Indications:              Screening for colorectal malignant neoplasm (last                            colonoscopy was 10 years ago) Medicines:                Monitored Anesthesia Care Procedure:                Pre-Anesthesia Assessment:                           - Prior to the procedure, a History and Physical                            was performed, and patient medications and                            allergies were reviewed. The patient's tolerance of                            previous anesthesia was also reviewed. The risks                            and benefits of the procedure and the sedation                            options and risks were discussed with the patient.                            All questions were answered, and informed consent                            was obtained. Prior Anticoagulants: The patient has                            taken no previous anticoagulant or antiplatelet                            agents. ASA Grade Assessment: II - A patient with                            mild systemic disease. After reviewing the risks                            and benefits, the patient was deemed in                            satisfactory condition to undergo the procedure.  After obtaining informed consent, the colonoscope                            was passed under direct vision. Throughout the                            procedure, the patient's blood pressure, pulse, and                            oxygen saturations were monitored continuously. The                            Colonoscope was introduced through the anus and                            advanced to the the cecum,  identified by                            transillumination. The colonoscopy was performed                            without difficulty. The patient tolerated the                            procedure well. The quality of the bowel                            preparation was fair. The terminal ileum, ileocecal                            valve, appendiceal orifice, and rectum were                            photographed. Scope In: 8:04:04 AM Scope Out: 8:27:19 AM Scope Withdrawal Time: 0 hours 17 minutes 47 seconds  Total Procedure Duration: 0 hours 23 minutes 15 seconds  Findings:                 The perianal and digital rectal examinations were                            normal.                           Five sessile polyps were found in the sigmoid colon                            (3) and ascending colon (2). The polyps were 3 to 6                            mm in size. These polyps were removed with a cold                            snare. Resection and retrieval were complete.  Estimated blood loss was minimal.                           A moderate amount of solid stool and solid food                            debris was found in the entire colon, interfering                            with visualization. Lavage of the area was                            performed using copious amounts, resulting in                            incomplete clearance with fair visualization. The                            colonoscope clogged several times with solid food                            debris, and therefore colon could not be completely                            cleared/completely visualized.                           Non-bleeding internal hemorrhoids were found during                            retroflexion. The hemorrhoids were small.                           The terminal ileum appeared normal. Complications:            No immediate complications. Estimated Blood  Loss:     Estimated blood loss was minimal. Impression:               - Preparation of the colon was fair.                           - Five 3 to 6 mm polyps in the sigmoid colon and in                            the ascending colon, removed with a cold snare.                            Resected and retrieved.                           - Stool in the entire examined colon.                           - Non-bleeding internal hemorrhoids.                           -  The examined portion of the ileum was normal. Recommendation:           - Patient has a contact number available for                            emergencies. The signs and symptoms of potential                            delayed complications were discussed with the                            patient. Return to normal activities tomorrow.                            Written discharge instructions were provided to the                            patient.                           - Resume previous diet.                           - Continue present medications.                           - Await pathology results.                           - Repeat colonoscopy in 6-12 months because the                            bowel preparation was suboptimal and for                            surveillance.                           - Return to GI office PRN. Gerrit Heck, MD 06/29/2020 8:35:18 AM

## 2020-07-03 ENCOUNTER — Telehealth: Payer: Self-pay | Admitting: *Deleted

## 2020-07-03 NOTE — Telephone Encounter (Signed)
°  Follow up Call-  Call back number 06/29/2020  Post procedure Call Back phone  # [phone]  Permission to leave phone message Yes  Some recent data might be hidden     Patient questions:  Do you have a fever, pain , or abdominal swelling? No. Pain Score  0 *  Have you tolerated food without any problems? yes  Have you been able to return to your normal activities? yes  Do you have any questions about your discharge instructions: Diet   No. Medications  No. Follow up visit  No.  Do you have questions or concerns about your Care? No.  Actions: * If pain score is 4 or above: No action needed, pain <4.  1. Have you developed a fever since your procedure? no  2.   Have you had an respiratory symptoms (SOB or cough) since your procedure? no  3.   Have you tested positive for COVID 19 since your procedure no  4.   Have you had any family members/close contacts diagnosed with the COVID 19 since your procedure?  no   If yes to any of these questions please route to Joylene John, RN and Joella Prince, RN

## 2020-07-19 ENCOUNTER — Encounter: Payer: Self-pay | Admitting: Gastroenterology

## 2020-09-17 ENCOUNTER — Encounter: Payer: Self-pay | Admitting: Osteopathic Medicine

## 2020-09-18 MED ORDER — TADALAFIL 10 MG PO TABS: 10.0000 mg | ORAL_TABLET | Freq: Every day | ORAL | 4 refills | Status: DC | PRN

## 2020-09-20 MED ORDER — TADALAFIL 5 MG PO TABS: 10.0000 mg | ORAL_TABLET | Freq: Every day | ORAL | 0 refills | Status: DC | PRN

## 2020-09-20 NOTE — Addendum Note (Signed)
Addended by: Maryla Morrow on: 09/20/2020 09:15 AM   Modules accepted: Orders

## 2020-09-21 ENCOUNTER — Other Ambulatory Visit: Payer: Self-pay

## 2020-09-21 MED ORDER — TADALAFIL 5 MG PO TABS: 10.0000 mg | ORAL_TABLET | Freq: Every day | ORAL | 0 refills | Status: DC | PRN

## 2020-09-27 DIAGNOSIS — L300 Nummular dermatitis: Secondary | ICD-10-CM | POA: Diagnosis not present

## 2020-09-27 DIAGNOSIS — L578 Other skin changes due to chronic exposure to nonionizing radiation: Secondary | ICD-10-CM | POA: Diagnosis not present

## 2020-09-27 DIAGNOSIS — L570 Actinic keratosis: Secondary | ICD-10-CM | POA: Diagnosis not present

## 2020-09-27 DIAGNOSIS — L821 Other seborrheic keratosis: Secondary | ICD-10-CM | POA: Diagnosis not present

## 2020-10-19 DIAGNOSIS — Z03818 Encounter for observation for suspected exposure to other biological agents ruled out: Secondary | ICD-10-CM | POA: Diagnosis not present

## 2020-10-19 DIAGNOSIS — Z20822 Contact with and (suspected) exposure to covid-19: Secondary | ICD-10-CM | POA: Diagnosis not present

## 2020-10-26 DIAGNOSIS — J329 Chronic sinusitis, unspecified: Secondary | ICD-10-CM | POA: Diagnosis not present

## 2020-10-26 DIAGNOSIS — J358 Other chronic diseases of tonsils and adenoids: Secondary | ICD-10-CM | POA: Diagnosis not present

## 2020-10-31 DIAGNOSIS — J329 Chronic sinusitis, unspecified: Secondary | ICD-10-CM | POA: Diagnosis not present

## 2020-11-01 DIAGNOSIS — J322 Chronic ethmoidal sinusitis: Secondary | ICD-10-CM | POA: Diagnosis not present

## 2020-11-07 ENCOUNTER — Other Ambulatory Visit: Payer: Self-pay | Admitting: Osteopathic Medicine

## 2020-11-07 DIAGNOSIS — U071 COVID-19: Secondary | ICD-10-CM | POA: Diagnosis not present

## 2020-11-20 DIAGNOSIS — H35371 Puckering of macula, right eye: Secondary | ICD-10-CM | POA: Diagnosis not present

## 2020-11-20 DIAGNOSIS — H59811 Chorioretinal scars after surgery for detachment, right eye: Secondary | ICD-10-CM | POA: Diagnosis not present

## 2020-11-20 DIAGNOSIS — H31092 Other chorioretinal scars, left eye: Secondary | ICD-10-CM | POA: Diagnosis not present

## 2020-11-20 DIAGNOSIS — H35341 Macular cyst, hole, or pseudohole, right eye: Secondary | ICD-10-CM | POA: Diagnosis not present

## 2020-12-03 DIAGNOSIS — J329 Chronic sinusitis, unspecified: Secondary | ICD-10-CM | POA: Diagnosis not present

## 2021-01-03 DIAGNOSIS — G4733 Obstructive sleep apnea (adult) (pediatric): Secondary | ICD-10-CM | POA: Diagnosis not present

## 2021-01-03 DIAGNOSIS — Z9989 Dependence on other enabling machines and devices: Secondary | ICD-10-CM | POA: Diagnosis not present

## 2021-01-29 IMAGING — DX DG CHEST 2V
2 series · 2 of 2 positions shown · non-contrast
Comparison: 09/11/2015 chest radiograph

CLINICAL DATA: Chest pain for 1 week.

EXAM:
CHEST - 2 VIEW

[chest pa]
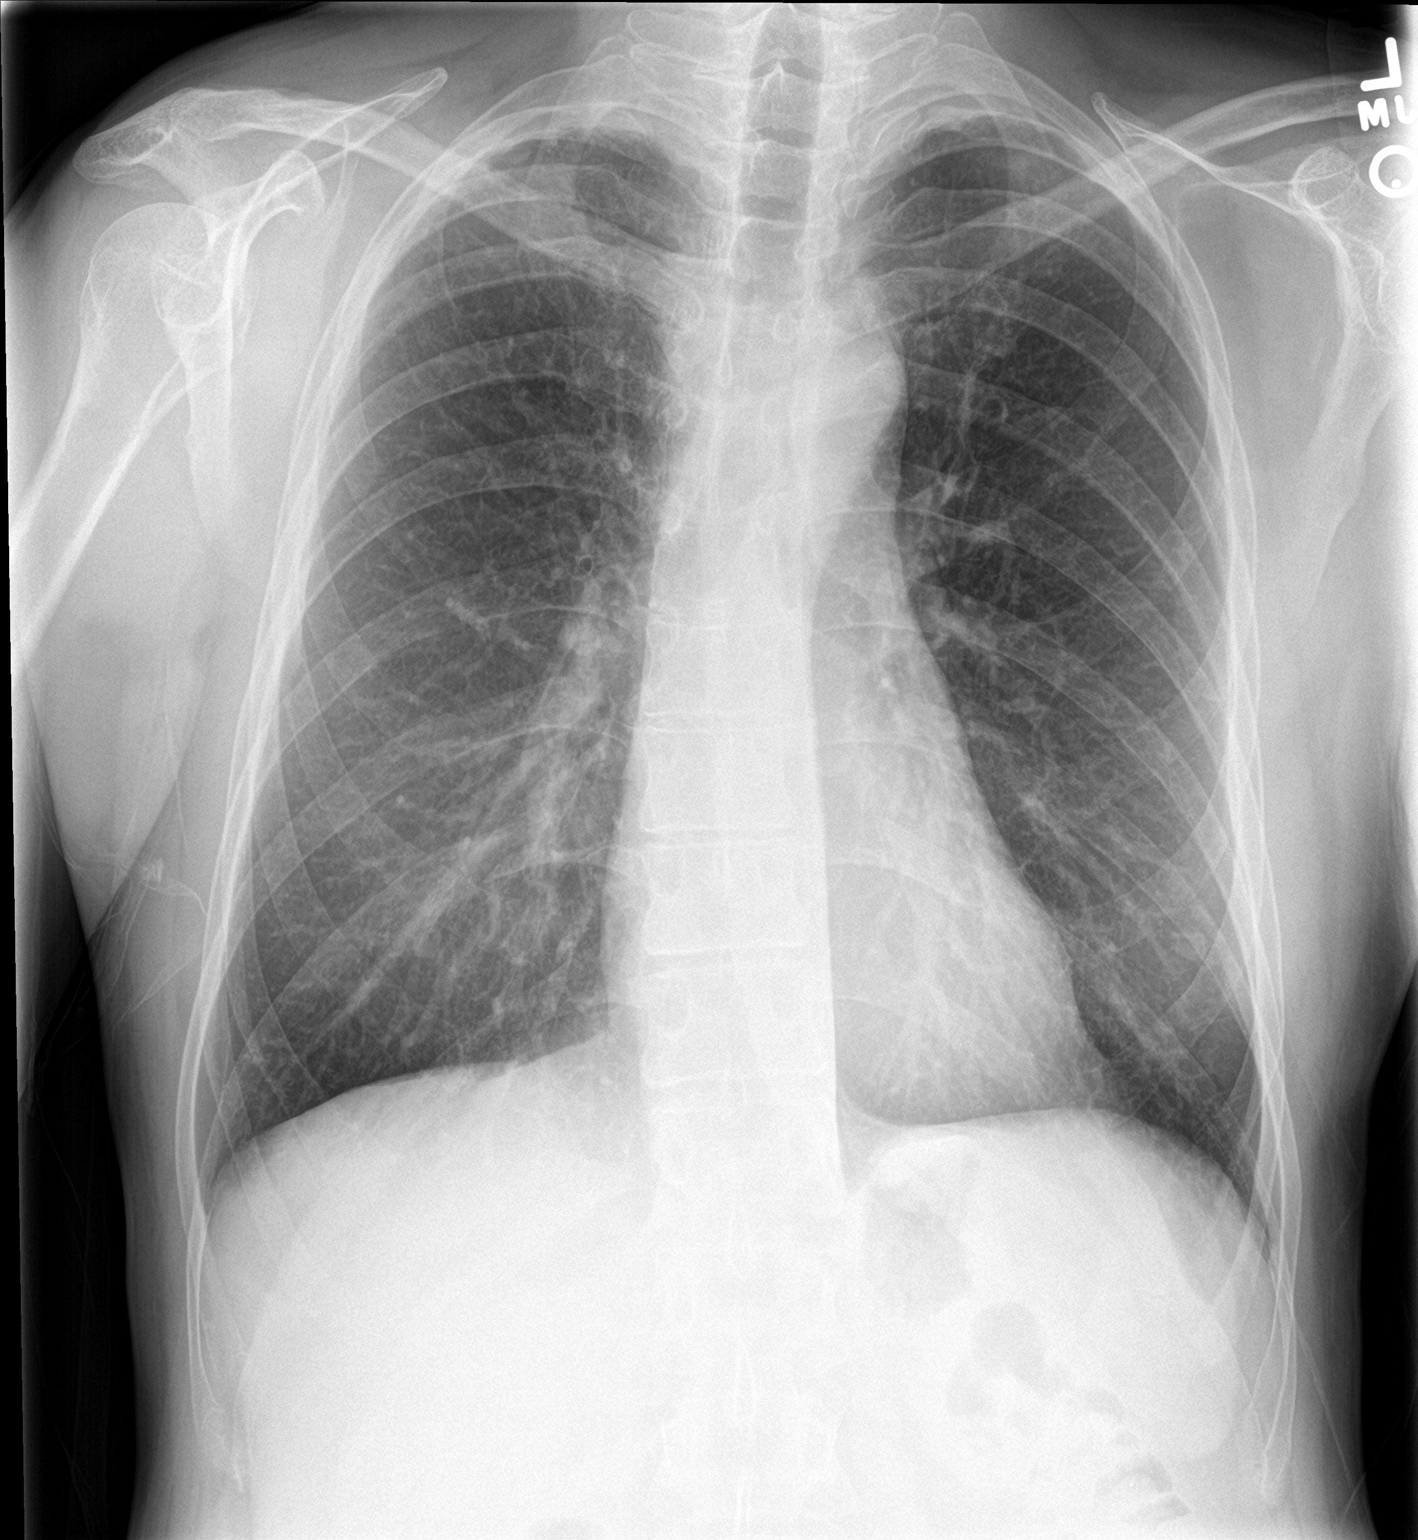

[chest lat]
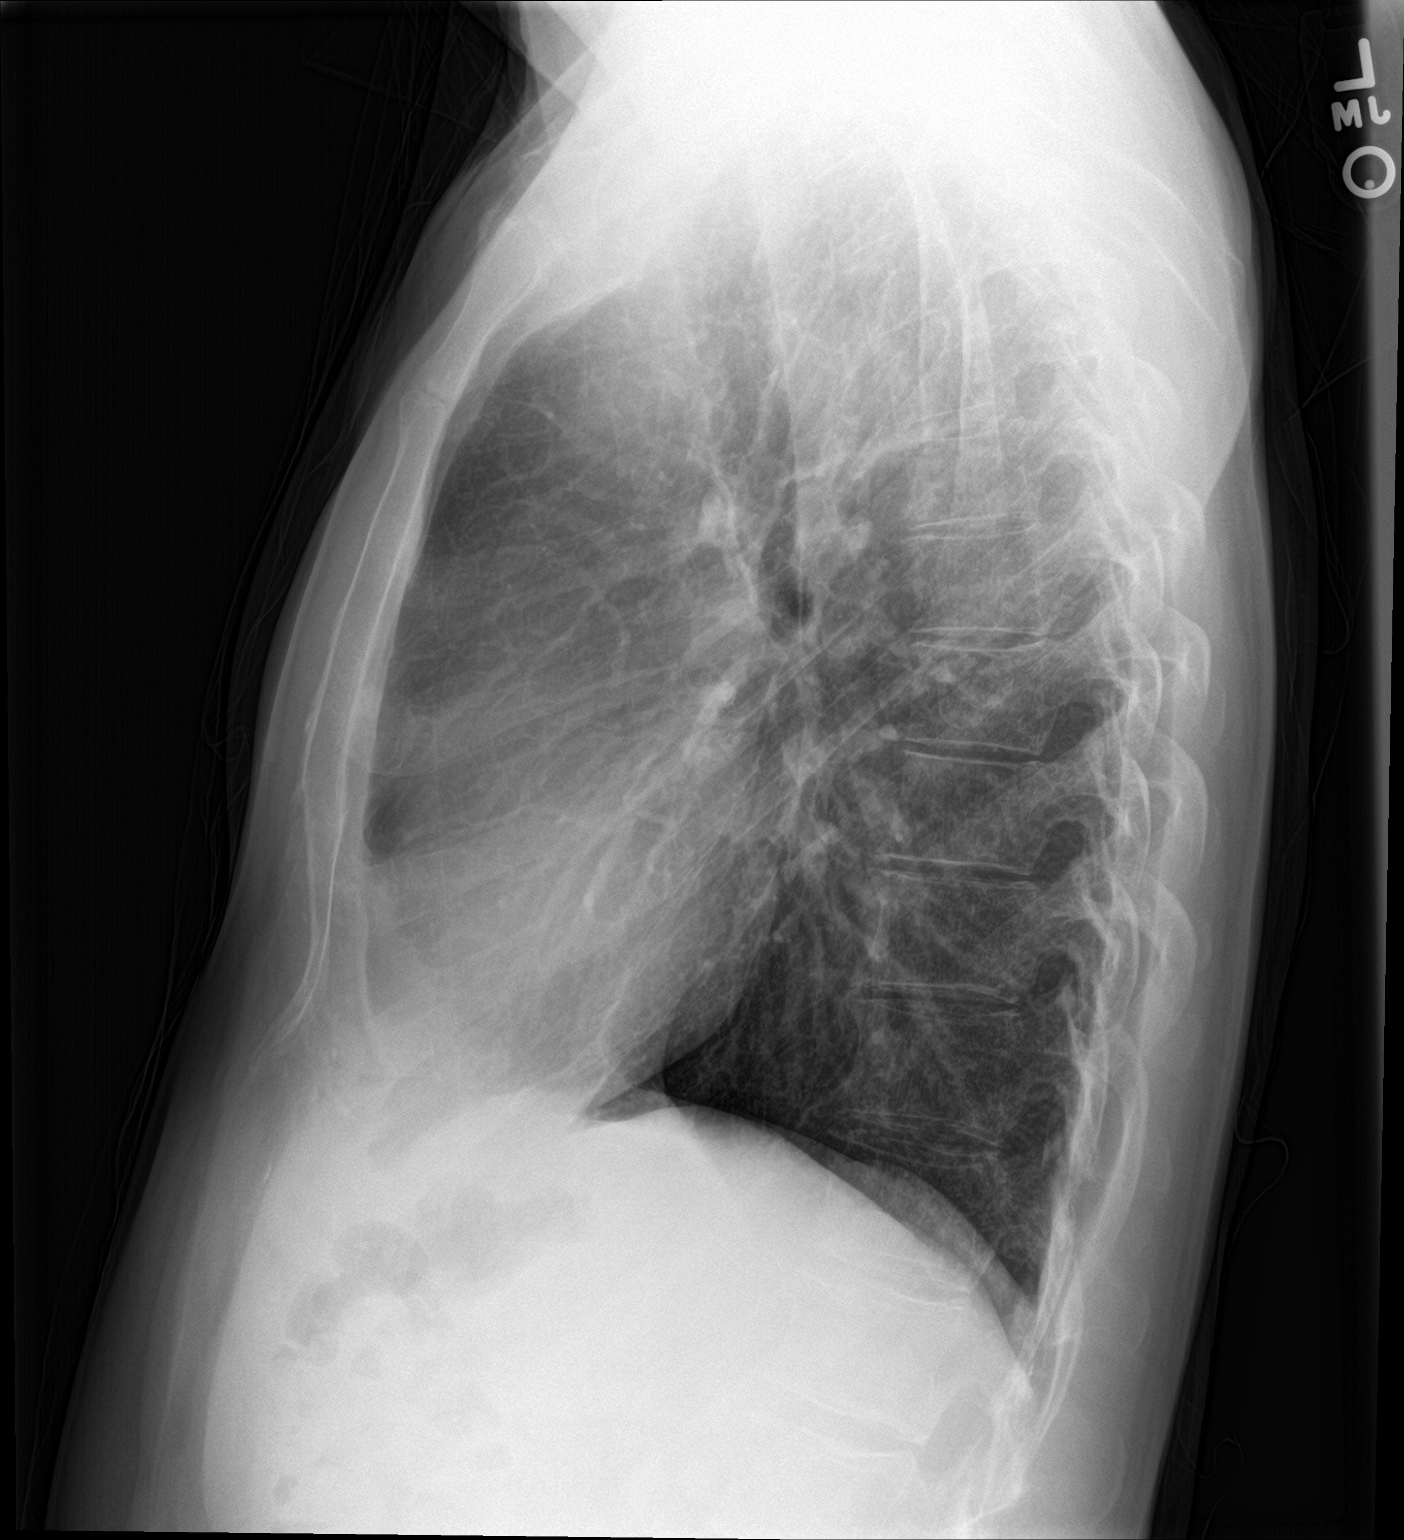

[2 of 2 positions shown; findings below may reference images not displayed]

FINDINGS: The cardiomediastinal silhouette is unremarkable.

There is no evidence of focal airspace disease, pulmonary edema,
suspicious pulmonary nodule/mass, pleural effusion, or pneumothorax.

No acute bony abnormalities are identified.
IMPRESSION: No active cardiopulmonary disease.

## 2021-01-30 DIAGNOSIS — R519 Headache, unspecified: Secondary | ICD-10-CM | POA: Diagnosis not present

## 2021-02-18 ENCOUNTER — Other Ambulatory Visit: Payer: Self-pay | Admitting: Osteopathic Medicine

## 2021-03-28 DIAGNOSIS — Z9889 Other specified postprocedural states: Secondary | ICD-10-CM | POA: Diagnosis not present

## 2021-03-28 DIAGNOSIS — J329 Chronic sinusitis, unspecified: Secondary | ICD-10-CM | POA: Diagnosis not present

## 2021-03-28 DIAGNOSIS — G4730 Sleep apnea, unspecified: Secondary | ICD-10-CM | POA: Diagnosis not present

## 2021-03-29 ENCOUNTER — Other Ambulatory Visit: Payer: Self-pay | Admitting: Osteopathic Medicine

## 2021-04-03 DIAGNOSIS — Z79899 Other long term (current) drug therapy: Secondary | ICD-10-CM | POA: Diagnosis not present

## 2021-04-03 DIAGNOSIS — K219 Gastro-esophageal reflux disease without esophagitis: Secondary | ICD-10-CM | POA: Diagnosis not present

## 2021-04-03 DIAGNOSIS — J329 Chronic sinusitis, unspecified: Secondary | ICD-10-CM | POA: Diagnosis not present

## 2021-04-03 DIAGNOSIS — G4730 Sleep apnea, unspecified: Secondary | ICD-10-CM | POA: Diagnosis not present

## 2021-04-03 DIAGNOSIS — J343 Hypertrophy of nasal turbinates: Secondary | ICD-10-CM | POA: Diagnosis not present

## 2021-04-17 DIAGNOSIS — G4733 Obstructive sleep apnea (adult) (pediatric): Secondary | ICD-10-CM | POA: Diagnosis not present

## 2021-04-17 DIAGNOSIS — G4440 Drug-induced headache, not elsewhere classified, not intractable: Secondary | ICD-10-CM | POA: Diagnosis not present

## 2021-04-17 DIAGNOSIS — R519 Headache, unspecified: Secondary | ICD-10-CM | POA: Diagnosis not present

## 2021-04-17 DIAGNOSIS — G43709 Chronic migraine without aura, not intractable, without status migrainosus: Secondary | ICD-10-CM | POA: Diagnosis not present

## 2021-04-17 DIAGNOSIS — Z9889 Other specified postprocedural states: Secondary | ICD-10-CM | POA: Diagnosis not present

## 2021-04-23 ENCOUNTER — Encounter: Payer: Self-pay | Admitting: Osteopathic Medicine

## 2021-04-23 ENCOUNTER — Ambulatory Visit (INDEPENDENT_AMBULATORY_CARE_PROVIDER_SITE_OTHER): Payer: BC Managed Care – PPO | Admitting: Osteopathic Medicine

## 2021-04-23 ENCOUNTER — Other Ambulatory Visit: Payer: Self-pay

## 2021-04-23 VITALS — BP 108/70 | HR 62 | Ht 72.0 in | Wt 178.8 lb

## 2021-04-23 DIAGNOSIS — Z0000 Encounter for general adult medical examination without abnormal findings: Secondary | ICD-10-CM | POA: Diagnosis not present

## 2021-04-23 DIAGNOSIS — R519 Headache, unspecified: Secondary | ICD-10-CM | POA: Diagnosis not present

## 2021-04-23 DIAGNOSIS — G4733 Obstructive sleep apnea (adult) (pediatric): Secondary | ICD-10-CM

## 2021-04-23 DIAGNOSIS — Z9889 Other specified postprocedural states: Secondary | ICD-10-CM

## 2021-04-23 DIAGNOSIS — N400 Enlarged prostate without lower urinary tract symptoms: Secondary | ICD-10-CM | POA: Diagnosis not present

## 2021-04-23 DIAGNOSIS — G8929 Other chronic pain: Secondary | ICD-10-CM

## 2021-04-23 MED ORDER — TADALAFIL 5 MG PO TABS: ORAL_TABLET | ORAL | 3 refills | Status: DC

## 2021-04-23 MED ORDER — TADALAFIL 10 MG PO TABS: 5.0000 mg | ORAL_TABLET | Freq: Every day | ORAL | 3 refills | Status: DC

## 2021-04-23 MED ORDER — TAMSULOSIN HCL 0.4 MG PO CAPS: 0.4000 mg | ORAL_CAPSULE | Freq: Every day | ORAL | 3 refills | Status: DC

## 2021-04-23 NOTE — Patient Instructions (Addendum)
General Preventive Care Most recent routine screening labs: ordered.  Blood pressure goal 130/80 or less.  Tobacco: don't!  Alcohol: responsible moderation is ok for most adults - if you have concerns about your alcohol intake, please talk to me!  Exercise: as tolerated to reduce risk of cardiovascular disease and diabetes. Strength training will also prevent osteoporosis.  Mental health: if need for mental health care (medicines, counseling, other), or concerns about moods, please let me know!  Sexual / Reproductive health: if need for STI testing, or if concerns with libido/pain problems, please let me know! If you need to discuss family planning, please let me know!  Advanced Directive: Living Will and/or Healthcare Power of Attorney recommended for all adults, regardless of age or health.  Vaccines Flu vaccine: for almost everyone, every fall.  Shingles vaccine: you had Zostavax in 2013. You are eligible for the newer vaccine, Shingrix, was available starting 2017 and is a 2-shot series.  Pneumonia vaccines: after age 22. Tetanus booster: every 10 years, due 2028 COVID vaccine: THANKS for getting your vaccine! :)  Cancer screenings  Colon cancer screening: Colonoscopy per GI  Prostate cancer screening: PSA blood test age 40-71 Lung cancer screening: not needed for non-smokers  Infection screenings  HIV: recommended screening at least once age 77-65, more often as needed. Gonorrhea/Chlamydia: screening as needed Hepatitis C: recommended once for everyone age 30-09 TB: certain at-risk populations, or depending on work requirements and/or travel history Other Bone Density Test: recommended for men at age 62 Abdominal Aortic Aneurysm: screening with ultrasound recommended once for men age 89-75 who have ever smoked

## 2021-04-23 NOTE — Progress Notes (Signed)
Blake Sims is a 60 y.o. male who presents to  [place] at [facility]  today, 04/23/21, seeking care for the following:  Annual / preventive care      ASSESSMENT & PLAN with other pertinent findings:  The primary encounter diagnosis was Annual physical exam. Diagnoses of OSA (obstructive sleep apnea), Benign prostatic hyperplasia without lower urinary tract symptoms, History of cardiac radiofrequency ablation for SVT, and Chronic nonintractable headache, unspecified headache type were also pertinent to this visit.    Patient Instructions  General Preventive Care Most recent routine screening labs: ordered.  Blood pressure goal 130/80 or less.  Tobacco: don't!  Alcohol: responsible moderation is ok for most adults - if you have concerns about your alcohol intake, please talk to me!  Exercise: as tolerated to reduce risk of cardiovascular disease and diabetes. Strength training will also prevent osteoporosis.  Mental health: if need for mental health care (medicines, counseling, other), or concerns about moods, please let me know!  Sexual / Reproductive health: if need for STI testing, or if concerns with libido/pain problems, please let me know! If you need to discuss family planning, please let me know!  Advanced Directive: Living Will and/or Healthcare Power of Attorney recommended for all adults, regardless of age or health.  Vaccines Flu vaccine: for almost everyone, every fall.  Shingles vaccine: you had Zostavax in 2013. You are eligible for the newer vaccine, Shingrix, was available starting 2017 and is a 2-shot series.  Pneumonia vaccines: after age 37. Tetanus booster: every 10 years, due 2028 COVID vaccine: THANKS for getting your vaccine! :)  Cancer screenings  Colon cancer screening: Colonoscopy per GI  Prostate cancer screening: PSA blood test age 58-71 Lung cancer screening: not needed for non-smokers  Infection  screenings  HIV: recommended screening at least once age 29-65, more often as needed. Gonorrhea/Chlamydia: screening as needed Hepatitis C: recommended once for everyone age 84-16 TB: certain at-risk populations, or depending on work requirements and/or travel history Other Bone Density Test: recommended for men at age 30 Abdominal Aortic Aneurysm: screening with ultrasound recommended once for men age 22-75 who have ever smoked   Orders Placed This Encounter  Procedures   CBC   COMPLETE METABOLIC PANEL WITH GFR   Lipid panel   Sedimentation rate   High sensitivity CRP   PSA    Meds ordered this encounter  Medications   DISCONTD: tadalafil (CIALIS) 5 MG tablet    Sig: TAKE TWO TABLETS BY MOUTH DAILY AS NEEDED FOR ERECTILE DYSFUNCTION - PT NEEDS TO MAKE AN APPOINTMENT FOR FURTHER REFILLS    Dispense:  90 tablet    Refill:  3   tamsulosin (FLOMAX) 0.4 MG CAPS capsule    Sig: Take 1 capsule (0.4 mg total) by mouth daily.    Dispense:  90 capsule    Refill:  3   tadalafil (CIALIS) 10 MG tablet    Sig: Take 0.5 tablets (5 mg total) by mouth daily.    Dispense:  90 tablet    Refill:  3    CANCEL 5 MG DOSE THANKS     See below for relevant physical exam findings  See below for recent lab and imaging results reviewed  Medications, allergies, PMH, PSH, SocH, FamH reviewed below    Follow-up instructions: Return in about 1 year (around 04/23/2022) for Welcome (call week prior to visit for lab orders).  Exam:  BP 108/70   Pulse 62   Ht 6' (1.829 m)   Wt 178 lb 12.8 oz (81.1 kg)   SpO2 97%   BMI 24.25 kg/m  Constitutional: VS see above. General Appearance: alert, well-developed, well-nourished, NAD Neck: No masses, trachea midline.  Respiratory: Normal respiratory effort. no wheeze, no rhonchi, no rales Cardiovascular: S1/S2 normal, no murmur, no rub/gallop auscultated. RRR.  Musculoskeletal: Gait  normal. Symmetric and independent movement of all extremities Abdominal: non-tender, non-distended, no appreciable organomegaly, neg Murphy's, BS WNLx4 Neurological: Normal balance/coordination. No tremor. Skin: warm, dry, intact.  Psychiatric: Normal judgment/insight. Normal mood and affect. Oriented x3.   Current Meds  Medication Sig   Multiple Vitamin (MULTIVITAMIN WITH MINERALS) TABS tablet Take 1 tablet by mouth daily.   [DISCONTINUED] tadalafil (CIALIS) 5 MG tablet TAKE TWO TABLETS BY MOUTH DAILY AS NEEDED FOR ERECTILE DYSFUNCTION - PT NEEDS TO MAKE AN APPOINTMENT FOR FURTHER REFILLS   [DISCONTINUED] tamsulosin (FLOMAX) 0.4 MG CAPS capsule TAKE ONE CAPSULE BY MOUTH DAILY    No Known Allergies  Patient Active Problem List   Diagnosis Date Noted   Recurrent sinusitis 03/14/2020   Recurrent acute suppurative otitis media without spontaneous rupture of tympanic membrane of both sides 03/14/2020   History of cardiac radiofrequency ablation 04/13/2019   Family history of prostate cancer in father 04/13/2019   Benign prostatic hyperplasia without lower urinary tract symptoms 04/13/2019   SVT (supraventricular tachycardia) ([place]) 12/14/2017   OSA (obstructive sleep apnea) 09/12/2015   Palpitations 02/23/2013    Family History  Problem Relation Age of Onset   Skin cancer Mother    High blood pressure Father    CAD Neg Hx        neg hx   Colon cancer Neg Hx    Colon polyps Neg Hx    Esophageal cancer Neg Hx    Rectal cancer Neg Hx    Stomach cancer Neg Hx     Social History   Tobacco Use  Smoking Status Never  Smokeless Tobacco Never    Past Surgical History:  Procedure Laterality Date   COLONOSCOPY     ~10 yrs ago- [facility]    INGUINAL HERNIA REPAIR Bilateral 06/08/2014   Procedure: LAPAROSCOPIC BILATERAL INGUINAL HERNIA REPAIR ;  Surgeon: Imogene Burn. Georgette Dover, MD;  Location: [place];  Service: General;  Laterality: Bilateral;   INSERTION OF MESH Bilateral 06/08/2014    Procedure: INSERTION OF MESH;  Surgeon: Imogene Burn. Georgette Dover, MD;  Location: [place];  Service: General;  Laterality: Bilateral;   RETINAL DETACHMENT REPAIR W/ SCLERAL BUCKLE LE Right 06   SVT ABLATION N/A 12/14/2017   Procedure: SVT ABLATION;  Surgeon: Evans Lance, MD;  Location: [place] [facility];  Service: Cardiovascular;  Laterality: N/A;   VASECTOMY  2011    Immunization History  Administered Date(s) Administered   Influenza,inj,Quad PF,6+ Mos 09/16/2018, 08/14/2019   Influenza-Unspecified 09/17/2017   PFIZER(Purple Top)SARS-COV-2 Vaccination 11/29/2019, 01/31/2020   Td 03/02/2007, 03/04/2017   Tdap 03/02/2007, 03/04/2017   Zoster Recombinat (Shingrix) 07/14/2012    No results found for this or any previous visit (from the past 2160 hour(s)).  No results found.     All questions at time of visit were answered - patient instructed to contact office with any additional concerns or updates. ER/RTC precautions were reviewed with the patient as applicable.   Please note: manual typing as well as voice recognition software may have been used to produce this document - typos may escape  review. Please contact Dr. Sheppard Coil for any needed clarifications.

## 2021-04-24 LAB — LIPID PANEL
Cholesterol: 212 mg/dL — ABNORMAL HIGH (ref <200)
HDL: 67 mg/dL (ref >=40)
LDL Cholesterol (Calc): 123 mg/dL (calc) — ABNORMAL HIGH
Non-HDL Cholesterol (Calc): 145 mg/dL (calc) — ABNORMAL HIGH (ref <130)
Total CHOL/HDL Ratio: 3.2 (calc) (ref <5.0)
Triglycerides: 112 mg/dL (ref <150)

## 2021-04-24 LAB — PSA: PSA: 4.01 ng/mL — ABNORMAL HIGH (ref <=4.00)

## 2021-04-24 LAB — CBC
HCT: 41.9 % (ref 38.5–50.0)
Hemoglobin: 14.4 g/dL (ref 13.2–17.1)
MCH: 32.1 pg (ref 27.0–33.0)
MCHC: 34.4 g/dL (ref 32.0–36.0)
MCV: 93.3 fL (ref 80.0–100.0)
MPV: 9 fL (ref 7.5–12.5)
Platelets: 202 10*3/uL (ref 140–400)
RBC: 4.49 10*6/uL (ref 4.20–5.80)
RDW: 12.3 % (ref 11.0–15.0)
WBC: 5.3 10*3/uL (ref 3.8–10.8)

## 2021-04-24 LAB — COMPLETE METABOLIC PANEL WITH GFR
AG Ratio: 1.7 (calc) (ref 1.0–2.5)
ALT: 34 U/L (ref 9–46)
AST: 20 U/L (ref 10–35)
Albumin: 4.1 g/dL (ref 3.6–5.1)
Alkaline phosphatase (APISO): 72 U/L (ref 35–144)
BUN: 16 mg/dL (ref 7–25)
CO2: 30 mmol/L (ref 20–32)
Calcium: 9.2 mg/dL (ref 8.6–10.3)
Chloride: 102 mmol/L (ref 98–110)
Creat: 0.94 mg/dL (ref 0.70–1.35)
Globulin: 2.4 g/dL (calc) (ref 1.9–3.7)
Glucose, Bld: 81 mg/dL (ref 65–99)
Potassium: 4.1 mmol/L (ref 3.5–5.3)
Sodium: 139 mmol/L (ref 135–146)
Total Bilirubin: 0.6 mg/dL (ref 0.2–1.2)
Total Protein: 6.5 g/dL (ref 6.1–8.1)
eGFR: 92 mL/min/{1.73_m2} (ref >=60)

## 2021-04-24 LAB — HIGH SENSITIVITY CRP: hs-CRP: 0.6 mg/L

## 2021-04-24 LAB — SEDIMENTATION RATE: Sed Rate: 2 mm/h (ref 0–20)

## 2021-05-06 DIAGNOSIS — Z9889 Other specified postprocedural states: Secondary | ICD-10-CM | POA: Diagnosis not present

## 2021-05-06 DIAGNOSIS — R519 Headache, unspecified: Secondary | ICD-10-CM | POA: Diagnosis not present

## 2021-06-08 ENCOUNTER — Telehealth: Payer: Self-pay | Admitting: Physician Assistant

## 2021-06-08 ENCOUNTER — Other Ambulatory Visit: Payer: Self-pay

## 2021-06-08 ENCOUNTER — Emergency Department (HOSPITAL_COMMUNITY)
Admission: EM | Admit: 2021-06-08 | Discharge: 2021-06-08 | Disposition: A | Discharge status: Home / Self Care | Payer: BC Managed Care – PPO | Attending: Emergency Medicine | Admitting: Emergency Medicine

## 2021-06-08 ENCOUNTER — Encounter (HOSPITAL_COMMUNITY): Payer: Self-pay | Admitting: Emergency Medicine

## 2021-06-08 ENCOUNTER — Emergency Department (HOSPITAL_COMMUNITY): Payer: BC Managed Care – PPO

## 2021-06-08 DIAGNOSIS — R9431 Abnormal electrocardiogram [ECG] [EKG]: Secondary | ICD-10-CM | POA: Diagnosis not present

## 2021-06-08 DIAGNOSIS — R0902 Hypoxemia: Secondary | ICD-10-CM | POA: Diagnosis not present

## 2021-06-08 DIAGNOSIS — R509 Fever, unspecified: Secondary | ICD-10-CM | POA: Diagnosis not present

## 2021-06-08 DIAGNOSIS — R000 Tachycardia, unspecified: Secondary | ICD-10-CM | POA: Diagnosis not present

## 2021-06-08 DIAGNOSIS — R0602 Shortness of breath: Secondary | ICD-10-CM | POA: Diagnosis not present

## 2021-06-08 DIAGNOSIS — R6884 Jaw pain: Secondary | ICD-10-CM | POA: Diagnosis not present

## 2021-06-08 DIAGNOSIS — I471 Supraventricular tachycardia: Secondary | ICD-10-CM | POA: Insufficient documentation

## 2021-06-08 DIAGNOSIS — R0789 Other chest pain: Secondary | ICD-10-CM | POA: Diagnosis not present

## 2021-06-08 DIAGNOSIS — R42 Dizziness and giddiness: Secondary | ICD-10-CM | POA: Diagnosis not present

## 2021-06-08 LAB — CBC WITH DIFFERENTIAL/PLATELET
Abs Immature Granulocytes: 0.04 10*3/uL (ref 0.00–0.07)
Basophils Absolute: 0 10*3/uL (ref 0.0–0.1)
Basophils Relative: 0 %
Eosinophils Absolute: 0 10*3/uL (ref 0.0–0.5)
Eosinophils Relative: 0 %
HCT: 45.2 % (ref 39.0–52.0)
Hemoglobin: 15.1 g/dL (ref 13.0–17.0)
Immature Granulocytes: 1 %
Lymphocytes Relative: 12 %
Lymphs Abs: 1 10*3/uL (ref 0.7–4.0)
MCH: 31 pg (ref 26.0–34.0)
MCHC: 33.4 g/dL (ref 30.0–36.0)
MCV: 92.8 fL (ref 80.0–100.0)
Monocytes Absolute: 0.9 10*3/uL (ref 0.1–1.0)
Monocytes Relative: 12 %
Neutro Abs: 5.7 10*3/uL (ref 1.7–7.7)
Neutrophils Relative %: 75 %
Platelets: 166 10*3/uL (ref 150–400)
RBC: 4.87 MIL/uL (ref 4.22–5.81)
RDW: 11.9 % (ref 11.5–15.5)
WBC: 7.7 10*3/uL (ref 4.0–10.5)
nRBC: 0 % (ref 0.0–0.2)

## 2021-06-08 LAB — BASIC METABOLIC PANEL
Anion gap: 7 (ref 5–15)
BUN: 13 mg/dL (ref 8–23)
CO2: 26 mmol/L (ref 22–32)
Calcium: 8.9 mg/dL (ref 8.9–10.3)
Chloride: 102 mmol/L (ref 98–111)
Creatinine, Ser: 1.13 mg/dL (ref 0.61–1.24)
GFR, Estimated: >60 mL/min (ref >60)
Glucose, Bld: 127 mg/dL — ABNORMAL HIGH (ref 70–99)
Potassium: 3.7 mmol/L (ref 3.5–5.1)
Sodium: 135 mmol/L (ref 135–145)

## 2021-06-08 LAB — MAGNESIUM: Magnesium: 2 mg/dL (ref 1.7–2.4)

## 2021-06-08 LAB — TROPONIN I (HIGH SENSITIVITY)
Troponin I (High Sensitivity): 8 ng/L (ref <18)
Troponin I (High Sensitivity): 13 ng/L (ref <18)

## 2021-06-08 LAB — TSH: TSH: 0.672 u[IU]/mL (ref 0.350–4.500)

## 2021-06-08 MED ORDER — ADENOSINE 6 MG/2ML IV SOLN: 6.0000 mg | Freq: Once | INTRAVENOUS | Status: AC

## 2021-06-08 MED ORDER — ACETAMINOPHEN 325 MG PO TABS
650.0000 mg | ORAL_TABLET | Freq: Four times a day (QID) | ORAL | Status: DC | PRN
  Administered 2021-06-08: 650 mg via ORAL
  Filled 2021-06-08: qty 2

## 2021-06-08 MED ORDER — ADENOSINE 6 MG/2ML IV SOLN
6.0000 mg | Freq: Once | INTRAVENOUS | Status: AC
  Administered 2021-06-08: 6 mg via INTRAVENOUS

## 2021-06-08 MED ORDER — ADENOSINE 6 MG/2ML IV SOLN
INTRAVENOUS | Status: AC
  Administered 2021-06-08: 6 mg via INTRAVENOUS
  Filled 2021-06-08: qty 6

## 2021-06-08 MED ORDER — METOPROLOL SUCCINATE ER 25 MG PO TB24: 25.0000 mg | ORAL_TABLET | Freq: Two times a day (BID) | ORAL | 0 refills | Status: DC

## 2021-06-08 MED ORDER — METOPROLOL TARTRATE 25 MG PO TABS: 25.0000 mg | ORAL_TABLET | Freq: Once | ORAL | 0 refills | Status: DC

## 2021-06-08 NOTE — Telephone Encounter (Signed)
Patient called back again.  Persistent tachycardia in 150s for past 10 minutes.  Symptomatic with jaw pain and dizziness.  Wife will bring him to [facility], ER.

## 2021-06-08 NOTE — ED Notes (Signed)
I pushed both doses of adenosine while Dr Pearline Cables was in the room and pt was on zoll

## 2021-06-08 NOTE — Consult Note (Addendum)
Cardiology Consultation:   Patient ID: Blake Sims MRN: [id number]; DOB: 1961/03/18  Admit date: 06/08/2021 Date of Consult: 06/08/2021  Primary Care Provider: Emeterio Reeve, DO [facility] Cardiologist: Mertie Moores, MD  [facility] Electrophysiologist:  Cristopher Peru, MD   Patient Profile:   Blake Sims is a 60 y.o. male with a hx of WPW with SVT s/p remote ablation who is being seen today for the evaluation of SVT at the request of Aletta Edouard, MD.  History of Present Illness:   Blake Sims is a 60yo male with a hx of WPW with pre excitation intermittently on EKGs and remote hx of SVT ablation.  Cards was called by ER MD due to patient presenting with SVT.  Apparently got the Shingles vaccine yesterday and has had fevers up to 102 degrees. The palpitations occurred this am with jaw pain and dizziness. His symptoms were identical to prior presentation with SVT and tried some of the propranolol he has to take PRN but it did not work.  Presented to the ER  with narrow complex SVT that terminated with adenosine but reoccurred several times each terminating with Adenosine.   EKG personally reviewed and showed SVT at 192bpm with diffuse ST depression.   Since going back into NSR he has not had any further jaw pain.  He denies any chest pain or SOB, PND, orthopnea, LE edema.  His jaw pain is identical to what he had with SVT remotely prior to his ablation and has resolved with termination of SVT.   hsTrop 8, K+ 3.7 and Mag 2.   Past Medical History:  Diagnosis Date   Allergy    Bilateral inguinal hernia without obstruction or gangrene 04/25/2014   Cataract    removed with lens implants , bilateral    Complication of anesthesia    trouble urinating after eye surgery 06   Family history of prostate cancer in father 04/13/2019   GERD (gastroesophageal reflux disease)    OCC- no meds    History of cardiac radiofrequency ablation 04/13/2019   Inguinal hernia     bilateral   OSA (obstructive sleep apnea) 09/12/2015   Rapid palpitations    a. Tachypalps, frog positive 2014 - suspected AVNRT but not documented. b. Recurrent palps 08/2015, again ceased before they could be recorded.   Sleep apnea    wears cpap , no 02    SVT (supraventricular tachycardia) ([place]) 12/14/2017    Past Surgical History:  Procedure Laterality Date   COLONOSCOPY     ~10 yrs ago- [facility]    INGUINAL HERNIA REPAIR Bilateral 06/08/2014   Procedure: LAPAROSCOPIC BILATERAL INGUINAL HERNIA REPAIR ;  Surgeon: Imogene Burn. Georgette Dover, MD;  Location: [place];  Service: General;  Laterality: Bilateral;   INSERTION OF MESH Bilateral 06/08/2014   Procedure: INSERTION OF MESH;  Surgeon: Imogene Burn. Georgette Dover, MD;  Location: [place];  Service: General;  Laterality: Bilateral;   RETINAL DETACHMENT REPAIR W/ SCLERAL BUCKLE LE Right 06   SVT ABLATION N/A 12/14/2017   Procedure: SVT ABLATION;  Surgeon: Evans Lance, MD;  Location: [place] [facility];  Service: Cardiovascular;  Laterality: N/A;   VASECTOMY  2011     Home Medications:  Prior to Admission medications   Medication Sig Start Date End Date Taking? Authorizing Provider  metoprolol succinate (TOPROL-XL) 25 MG 24 hr tablet Take 1 tablet (25 mg total) by mouth in the morning and at bedtime. 06/08/21 [id number] Yes Campbell Stall P, DO  metoprolol tartrate (  LOPRESSOR) 25 MG tablet Take 1 tablet (25 mg total) by mouth once for 1 dose. 06/08/21 [id number] Yes Campbell Stall P, DO  Multiple Vitamin (MULTIVITAMIN WITH MINERALS) TABS tablet Take 1 tablet by mouth daily.    [provider]  nortriptyline (PAMELOR) 10 MG capsule Take 10 mg by mouth at bedtime. 04/18/21   [provider]  tadalafil (CIALIS) 10 MG tablet Take 0.5 tablets (5 mg total) by mouth daily. 04/23/21   Emeterio Reeve, DO  tamsulosin (FLOMAX) 0.4 MG CAPS capsule Take 1 capsule (0.4 mg total) by mouth daily. 04/23/21   Emeterio Reeve, DO    Inpatient Medications: Scheduled  Meds:  Continuous Infusions:  PRN Meds:   Allergies:   No Known Allergies  Social History:   Social History   Socioeconomic History   Marital status: Married    Spouse name: Not on file   Number of children: Not on file   Years of education: Not on file   Highest education level: Not on file  Occupational History   Occupation: Ecologist: [employer]  Tobacco Use   Smoking status: Never   Smokeless tobacco: Never  Vaping Use   Vaping Use: Never used  Substance and Sexual Activity   Alcohol use: No   Drug use: No   Sexual activity: Yes    Partners: Female    Birth control/protection: Other-see comments    Comment: Vasectomy  Other Topics Concern   Not on file  Social History Narrative   Not on file   Social Determinants of Health   Financial Resource Strain: Not on file  Food Insecurity: Not on file  Transportation Needs: Not on file  Physical Activity: Not on file  Stress: Not on file  Social Connections: Not on file  Intimate Partner Violence: Not on file    Family History:    Family History  Problem Relation Age of Onset   Skin cancer Mother    High blood pressure Father    CAD Neg Hx        neg hx   Colon cancer Neg Hx    Colon polyps Neg Hx    Esophageal cancer Neg Hx    Rectal cancer Neg Hx    Stomach cancer Neg Hx      ROS:  Please see the history of present illness.   All other ROS reviewed and negative.     Physical Exam/Data:   Vitals:   06/08/21 1600 06/08/21 1615 06/08/21 1630 06/08/21 1639  BP: (!) 133/93 112/69 110/81   Pulse: 92 81 (!) 102   Resp: (!) 28 (!) 26 (!) 27   Temp:    100 F (37.8 C)  TempSrc:    Oral  SpO2: 98% 100% 96%   Weight:      Height:       No intake or output data in the 24 hours ending 06/08/21 1640 Last 3 Weights 06/08/2021 04/23/2021 06/29/2020  Weight (lbs) 175 lb 178 lb 12.8 oz 165 lb  Weight (kg) 79.379 kg 81.103 kg 74.844 kg     Body mass index is 23.73 kg/m.  General:   Well nourished, well developed, in no acute distress HEENT: normal Lymph: no adenopathy Neck: no JVD Endocrine:  No thryomegaly Vascular: No carotid bruits; FA pulses 2+ bilaterally without bruits  Cardiac:  normal S1, S2; RRR; no murmur  Lungs:  clear to auscultation bilaterally, no wheezing, rhonchi or rales  Abd: soft, nontender, no hepatomegaly  Ext: no edema Musculoskeletal:  No deformities, BUE and BLE strength normal and equal Skin: warm and dry  Neuro:  CNs 2-12 intact, no focal abnormalities noted Psych:  Normal affect   EKG:  The EKG was personally reviewed and demonstrates: narrow complex DVT at 192bpm with diffuse ST depression Telemetry:  Telemetry was personally reviewed and demonstrates:  multiple episodes of narrow complex SVT with termination to NSR with adenosine  Relevant CV Studies: none  Laboratory Data:  High Sensitivity Troponin:   Recent Labs  Lab 06/08/21 1450  TROPONINIHS 8     Chemistry Recent Labs  Lab 06/08/21 1450  NA 135  K 3.7  CL 102  CO2 26  GLUCOSE 127*  BUN 13  CREATININE 1.13  CALCIUM 8.9  GFRNONAA >60  ANIONGAP 7    No results for input(s): PROT, ALBUMIN, AST, ALT, ALKPHOS, BILITOT in the last 168 hours. Hematology Recent Labs  Lab 06/08/21 1450  WBC 7.7  RBC 4.87  HGB 15.1  HCT 45.2  MCV 92.8  MCH 31.0  MCHC 33.4  RDW 11.9  PLT 166   BNPNo results for input(s): BNP, PROBNP in the last 168 hours.  DDimer No results for input(s): DDIMER in the last 168 hours.   Radiology/Studies:  DG Chest Portable 1 View  Result Date: 06/08/2021 CLINICAL DATA:  Rapid heart rate, chest tightness. EXAM: PORTABLE CHEST 1 VIEW COMPARISON:  Chest x-ray dated 09/20/2019 FINDINGS: Heart size and mediastinal contours are stable. Pacer heads overlie the central/LEFT heart. Lungs are clear. No pleural effusion or pneumothorax is seen. Osseous structures about the chest are unremarkable. IMPRESSION: No active disease. No evidence of  pneumonia or pulmonary edema. Electronically Signed   By: Franki Cabot M.D.   On: 06/08/2021 14:38     Assessment and Plan:   Narrow complex SVT -he has a hx of WPW and SVT in the past s/p remote ablation by Dr. Lovena Le -on review of prior EKGs he has intermittent pre excitation -no hx of atrial fibrillation or flutter in the past -just had Shingles vaccine a few days ago and has had some fevers that may have contributed -responded to Adenosine but had several reoccurrences requiring adenosine -Currently in NSR -Discussed case with Dr. Curt Bears who recommended starting Lopressor '25mg'$  BID  -would monitor him for a few hours and if no reoccurrence of SVT then ok to discharge home -we will get followup with Dr. Lovena Le next week -I instructed patient to come back to ER if he has any further recurrences  2.  Jaw pain -this occurred in the setting of SVT at 192bpm with ST depression diffusely likely related to demand ischemia from tachycardia -hsTrop normal -repeat EKG in NSR -recommend repeat EKG as outpt -consider coronary CTA as outpt  I have spent a total of 60 minutes with patient reviewing prior EKGs, OV notes from EP, extensive tele review since patient admitted to ER including after each trial of Adenosine , EKGs, labs and examining patient as well as establishing an assessment and plan that was discussed with the patient.  > 50% of time was spent in direct patient care.     {For questions or updates, please contact [place] Please consult [email or web address] for contact info under    Signed, Fransico Him, MD  06/08/2021 4:40 PM

## 2021-06-08 NOTE — ED Notes (Signed)
Pt keeps going in and out of SVT

## 2021-06-08 NOTE — Progress Notes (Signed)
Called by ER MD due to patient presenting with SVT.  Has hx of WPW in the past but no afib.  Has hx of remote ablation for SVT.  Has intermittent preexcitation on baseline EKG in NSR.  Presented today with narrow complex SVT that terminated with adenosine but reoccurred and then terminated with another dose of Adenosine.  Apparently go the Shingles vaccine yesterday and has had fevers up to 102 degrees. The palpitations occurred this am with chest pain and dizziness.  EKG personally reviewed and showed SVT at 192bpm with diffuse ST depression.   No further CP after resolution of tachycardia.  hsTrop 8, K+ 3.7 and Mag 2. Discussed case with Dr. Curt Bears who recommended starting Lopressor '25mg'$  BID and will get back in with Dr. Lovena Le this week in office.

## 2021-06-08 NOTE — ED Provider Notes (Addendum)
Emergency Medicine Provider Triage Evaluation Note  Blake Sims , a 60 y.o. male  was evaluated in triage.  Pt complains of chest pain, palpitations.  History of SVT s/p ablation states that he has been having symptoms intermittently since 4 AM seems to be worsening and increasing in frequency.  Took his home BB today  Review of Systems  Positive: Heart palpitations Negative: Fever  Physical Exam  There were no vitals taken for this visit. Gen:   Awake, no distress   Resp:  Normal effort  MSK:   Moves extremities without difficulty  Other:  Tachycardia.  Regular rate.  Medical Decision Making  Medically screening exam initiated at 2:23 PM.  Appropriate orders placed.  Juanda Bond Nodarse was informed that the remainder of the evaluation will be completed by another provider, this initial triage assessment does not replace that evaluation, and the importance of remaining in the ED until their evaluation is complete.   Patient will be moved back to major care.  Took a BB today  Tedd Sias, [place] 06/08/21 [place], [place], [place] 06/08/21 1425    Lacretia Leigh, MD 06/11/21 [phone]

## 2021-06-08 NOTE — ED Notes (Signed)
Pt placed on zoll.

## 2021-06-08 NOTE — ED Triage Notes (Signed)
Patient coming from home, complaint of rapid HR, states has been feeling like this since 4 am coming and going but getting more consistent. States hx of ablation.

## 2021-06-08 NOTE — Telephone Encounter (Signed)
Patient got Shingles vaccine yesterday evening.  This morning around 4 AM he is starting to having intermittent palpitations with chest pain and dizziness.  Heart rate goes to 150s.  Each episode lasting for 30 to 40 seconds.  Occurring multiple times per hour.  Apple Watch reading as A. Fib. Patient with prior history of SVT status post ablation.  He also had a temperature of 102 which improved to 100 after Tylenol.  Similar feeling when he had palpitation prior to his ablation.  I have recommended to continue to monitor symptoms.  If persistent tachycardia he may try vagal maneuver (he knows from past experience).  Hopefully symptoms improves with time.  Will call us if worsening symptoms or go to ER.

## 2021-06-08 NOTE — ED Provider Notes (Signed)
Signout from Dr. Pearline Cables.  60 year old male here for shortness of breath.  History of SVT.  Has had an ablation by Dr. Lovena Le.  He has had a few episodes of SVT here.  Currently in sinus.  Cardiology has been consulted and they are recommending beta-blocker.  This has been ordered.  Plan is to observe for 2 hours and if remains in normal sinus rhythm can be discharged to follow-up outpatient with cardiology.  Prescriptions have been written. Physical Exam  BP 115/77   Pulse 79   Temp 100 F (37.8 C) (Oral)   Resp 20   Ht 6' (1.829 m)   Wt 79.4 kg   SpO2 100%   BMI 23.73 kg/m   Physical Exam  ED Course/Procedures     Procedures  MDM  Patient observed for 2 hours on cardiac monitor with no further arrhythmias.  He is otherwise asymptomatic.  Reviewed discharge plan with patient he is comfortable plan for discharge and outpatient follow-up with cardiology.  Prescriptions provided by Dr. Pearline Cables.  Return instructions discussed.       Blake Rasmussen, MD 06/09/21 [phone]

## 2021-06-08 NOTE — ED Provider Notes (Signed)
[facility] EMERGENCY DEPARTMENT Provider Note   CSN: [id number] Arrival date & time: 06/08/21  1415     History Chief Complaint  Patient presents with   Atrial Fibrillation    Blake Sims is a 60 y.o. male.  Patient is a 60 yo male presenting for sob. Patient admits to sob and palpitations that started 4AM this morning. Hx of SVT with ablation by Dr. Crissie Sickles in March of 2019 with conversion. Takes propranolol as needed. First time use today PTA.   The history is provided by the patient. No language interpreter was used.  Atrial Fibrillation This is a recurrent problem. The current episode started less than 1 hour ago. Associated symptoms include chest pain and shortness of breath. Pertinent negatives include no abdominal pain. He has tried nothing for the symptoms.      Past Medical History:  Diagnosis Date   Allergy    Bilateral inguinal hernia without obstruction or gangrene 04/25/2014   Cataract    removed with lens implants , bilateral    Complication of anesthesia    trouble urinating after eye surgery 06   Family history of prostate cancer in father 04/13/2019   GERD (gastroesophageal reflux disease)    OCC- no meds    History of cardiac radiofrequency ablation 04/13/2019   Inguinal hernia    bilateral   OSA (obstructive sleep apnea) 09/12/2015   Rapid palpitations    a. Tachypalps, frog positive 2014 - suspected AVNRT but not documented. b. Recurrent palps 08/2015, again ceased before they could be recorded.   Sleep apnea    wears cpap , no 02    SVT (supraventricular tachycardia) ([place]) 12/14/2017    Patient Active Problem List   Diagnosis Date Noted   Recurrent sinusitis 03/14/2020   Recurrent acute suppurative otitis media without spontaneous rupture of tympanic membrane of both sides 03/14/2020   History of cardiac radiofrequency ablation 04/13/2019   Family history of prostate cancer in father 04/13/2019   Benign prostatic  hyperplasia without lower urinary tract symptoms 04/13/2019   SVT (supraventricular tachycardia) ([place]) 12/14/2017   OSA (obstructive sleep apnea) 09/12/2015   Palpitations 02/23/2013    Past Surgical History:  Procedure Laterality Date   COLONOSCOPY     ~10 yrs ago- [place] Bilateral 06/08/2014   Procedure: LAPAROSCOPIC BILATERAL INGUINAL HERNIA REPAIR ;  Surgeon: Imogene Burn. Georgette Dover, MD;  Location: [place];  Service: General;  Laterality: Bilateral;   INSERTION OF MESH Bilateral 06/08/2014   Procedure: INSERTION OF MESH;  Surgeon: Imogene Burn. Georgette Dover, MD;  Location: [place];  Service: General;  Laterality: Bilateral;   RETINAL DETACHMENT REPAIR W/ SCLERAL BUCKLE LE Right 06   SVT ABLATION N/A 12/14/2017   Procedure: SVT ABLATION;  Surgeon: Evans Lance, MD;  Location: [place] [facility];  Service: Cardiovascular;  Laterality: N/A;   VASECTOMY  2011       Family History  Problem Relation Age of Onset   Skin cancer Mother    High blood pressure Father    CAD Neg Hx        neg hx   Colon cancer Neg Hx    Colon polyps Neg Hx    Esophageal cancer Neg Hx    Rectal cancer Neg Hx    Stomach cancer Neg Hx     Social History   Tobacco Use   Smoking status: Never   Smokeless tobacco: Never  Vaping Use  Vaping Use: Never used  Substance Use Topics   Alcohol use: No   Drug use: No    Home Medications Prior to Admission medications   Medication Sig Start Date End Date Taking? Authorizing Provider  Multiple Vitamin (MULTIVITAMIN WITH MINERALS) TABS tablet Take 1 tablet by mouth daily.    [provider]  nortriptyline (PAMELOR) 10 MG capsule Take 10 mg by mouth at bedtime. 04/18/21   [provider]  tadalafil (CIALIS) 10 MG tablet Take 0.5 tablets (5 mg total) by mouth daily. 04/23/21   Emeterio Reeve, DO  tamsulosin (FLOMAX) 0.4 MG CAPS capsule Take 1 capsule (0.4 mg total) by mouth daily. 04/23/21   Emeterio Reeve, DO    Allergies     Patient has no known allergies.  Review of Systems   Review of Systems  Constitutional:  Negative for chills and fever.  HENT:  Negative for ear pain and sore throat.   Eyes:  Negative for pain and visual disturbance.  Respiratory:  Positive for shortness of breath. Negative for cough.   Cardiovascular:  Positive for chest pain and palpitations.  Gastrointestinal:  Negative for abdominal pain and vomiting.  Genitourinary:  Negative for dysuria and hematuria.  Musculoskeletal:  Negative for arthralgias and back pain.  Skin:  Negative for color change and rash.  Neurological:  Negative for seizures and syncope.  All other systems reviewed and are negative.  Physical Exam Updated Vital Signs BP 96/79 (BP Location: Right Arm)   Pulse (!) 194   Temp 98.8 F (37.1 C) (Oral)   Resp 18   Ht 6' (1.829 m)   Wt 79.4 kg   SpO2 99%   BMI 23.73 kg/m   Physical Exam Vitals and nursing note reviewed.  Constitutional:      Appearance: He is well-developed.  HENT:     Head: Normocephalic and atraumatic.  Eyes:     Conjunctiva/sclera: Conjunctivae normal.  Cardiovascular:     Rate and Rhythm: Regular rhythm. Tachycardia present.     Heart sounds: No murmur heard. Pulmonary:     Effort: Pulmonary effort is normal. No respiratory distress.     Breath sounds: Normal breath sounds.  Abdominal:     Palpations: Abdomen is soft.     Tenderness: There is no abdominal tenderness.  Musculoskeletal:     Cervical back: Neck supple.  Skin:    General: Skin is warm and dry.  Neurological:     Mental Status: He is alert.    ED Results / Procedures / Treatments   Labs (all labs ordered are listed, but only abnormal results are displayed) Labs Reviewed  BASIC METABOLIC PANEL  MAGNESIUM  CBC WITH DIFFERENTIAL/PLATELET  TSH    EKG SVT with rate of 190 bpm.   Radiology No results found.  Procedures .Critical Care  Date/Time: 06/08/2021 2:49 PM Performed by: Lianne Cure,  DO Authorized by: Lianne Cure, DO   Critical care provider statement:    Critical care time (minutes):  34   Critical care was necessary to treat or prevent imminent or life-threatening deterioration of the following conditions:  Cardiac failure (SVT rate 190)   Critical care was time spent personally by me on the following activities:  Development of treatment plan with patient or surrogate, discussions with consultants, evaluation of patient's response to treatment, obtaining history from patient or surrogate, ordering and performing treatments and interventions, ordering and review of laboratory studies, ordering and review of radiographic studies, re-evaluation of patient's condition  and review of old charts   I assumed direction of critical care for this patient from another provider in my specialty: yes     Care discussed with: admitting provider     Care discussed with comment:  Cardiology consult   Medications Ordered in ED Medications - No data to display  ED Course  I have reviewed the triage vital signs and the nursing notes.  Pertinent labs & imaging results that were available during my care of the patient were reviewed by me and considered in my medical decision making (see chart for details).    MDM Rules/Calculators/A&P                           2:44 PM  60 yo male presenting for sob, chest pain, and palpitations. Patient is Aox3, no acute distress, afebrile, SVT on 12 lead. Conversion to normal sinus rhythm after modified vagal maneuver that lasted only minutes.   Patient successfully converted to normal sinus rhythm after two rounds of adenosine. Labs and CXR pending. I spoke with cardiologist on call who recommends starting metoprolol 25 mg bid orally and monitoring in ED for two hours. Plan for dispo home if no repeat SVT. Plan for admission with EP consult if SVT re-occurs. Patient agreeable to plan.   4:12 PM Patient signed out to oncoming provider.    Final  Clinical Impression(s) / ED Diagnoses Final diagnoses:  SVT (supraventricular tachycardia) ([place])    Rx / DC Orders ED Discharge Orders     None        Lianne Cure, DO [id number]

## 2021-06-10 ENCOUNTER — Encounter (INDEPENDENT_AMBULATORY_CARE_PROVIDER_SITE_OTHER): Payer: BC Managed Care – PPO | Admitting: Osteopathic Medicine

## 2021-06-11 ENCOUNTER — Other Ambulatory Visit: Payer: Self-pay

## 2021-06-11 ENCOUNTER — Ambulatory Visit: Payer: BC Managed Care – PPO | Admitting: Cardiovascular Disease

## 2021-06-11 ENCOUNTER — Encounter: Payer: Self-pay | Admitting: Cardiovascular Disease

## 2021-06-11 VITALS — BP 112/68 | HR 56 | Ht 72.0 in | Wt 176.0 lb

## 2021-06-11 DIAGNOSIS — I471 Supraventricular tachycardia: Secondary | ICD-10-CM

## 2021-06-11 DIAGNOSIS — R002 Palpitations: Secondary | ICD-10-CM

## 2021-06-11 MED ORDER — METOPROLOL SUCCINATE ER 25 MG PO TB24: 25.0000 mg | ORAL_TABLET | Freq: Every day | ORAL | 3 refills | Status: DC

## 2021-06-11 MED ORDER — PROPRANOLOL HCL 20 MG PO TABS: ORAL_TABLET | ORAL | 3 refills | Status: DC

## 2021-06-11 NOTE — Progress Notes (Signed)
Cardiology Office Note:    Date:  06/11/2021   ID:  Blake Sims, DOB May 23, 1961, MRN [id number]  PCP:  Emeterio Reeve, DO  Cardiologist:    Eudora Guevarra  Electrophysiologist:  Lovena Le   Referring MD: Emeterio Reeve, DO   Chief Complaint  Patient presents with   Tachycardia    Oct. 13, 2020    Blake Sims is a 60 y.o. male with a hx of SVT - s/p ablation by Dr. Lovena Le - March 2019.   He now has had several episodes of a rapid, regular episode  Might last for 30 seconds - 1 minute or so  He terminates with a cough  He performed carotid sinus massage and immediately corrected the SVT.  these paplitations returned several months ago Originally thougtht they were caffiene related.  Has largely cut out his caffeine intake.  He drinks decaffeinated coffee and only drinks 1 or 2 cups a day.  He avoids chocolate.  Also has GERD so he avoids peppermint, caffiene.   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     November 01, 2019:  Sem is seen today for follow-up of his supraventricular tachycardia. He is done well.  Is not had any episodes of tachycardia.  He wore an event monitor but it did not show any episodes of SVT.  It showed some very nonspecific and insignificant episodes of nonsustained ventricular tachycardia. Is very active.  Doing all that he needs to do .  Has not had to take any inderal. No significant episodes since his ablation   June 11, 2021: Blake Sims is seen today for follow-up of his SVT. He is status post SVT ablation in 2019.  He was seen in the emergency room on August 27 with recurrent SVT.  He had received the shingles vaccine the day prior and had a temperature of 102 degrees.  The SVT resolved with IV adenosine but recurred several times.  The episode where the episodes were associated with some jaw pain which is typical for his episodes of SVT. Was started on toprol  XL 25 mg a day  Has had some fatigue   Past Medical History:  Diagnosis Date   Allergy    Bilateral inguinal hernia without obstruction or gangrene 04/25/2014   Cataract    removed with lens implants , bilateral    Complication of anesthesia    trouble urinating after eye surgery 06   Family history of prostate cancer in father 04/13/2019   GERD (gastroesophageal reflux disease)    OCC- no meds    History of cardiac radiofrequency ablation 04/13/2019   Inguinal hernia    bilateral   OSA (obstructive sleep apnea) 09/12/2015   Rapid palpitations    a. Tachypalps, frog positive 2014 - suspected AVNRT but not documented. b. Recurrent palps 08/2015, again ceased before they could be recorded.   Sleep apnea    wears cpap , no 02    SVT (supraventricular tachycardia) ([place]) 12/14/2017    Past Surgical History:  Procedure Laterality Date   COLONOSCOPY     ~10 yrs ago- [place]    INGUINAL HERNIA REPAIR Bilateral 06/08/2014   Procedure: LAPAROSCOPIC BILATERAL INGUINAL HERNIA REPAIR ;  Surgeon: Imogene Burn. Georgette Dover, MD;  Location: [place];  Service: General;  Laterality: Bilateral;   INSERTION OF MESH Bilateral 06/08/2014   Procedure: INSERTION OF MESH;  Surgeon: Imogene Burn. Georgette Dover, MD;  Location:  [facility];  Service: General;  Laterality: Bilateral;   RETINAL DETACHMENT REPAIR W/ SCLERAL BUCKLE LE Right 06   SVT ABLATION N/A 12/14/2017   Procedure: SVT ABLATION;  Surgeon: Evans Lance, MD;  Location: [place] [facility];  Service: Cardiovascular;  Laterality: N/A;   VASECTOMY  2011    Current Medications: Current Meds  Medication Sig   baclofen (LIORESAL) 10 MG tablet Take 10 mg by mouth 3 (three) times daily as needed.   metoprolol succinate (TOPROL-XL) 25 MG 24 hr tablet Take 1 tablet (25 mg total) by mouth in the morning and at bedtime.   Multiple Vitamin (MULTIVITAMIN WITH MINERALS) TABS tablet Take 1 tablet by mouth daily.   nortriptyline (PAMELOR) 10 MG capsule Take 10 mg by mouth at bedtime.    tadalafil (CIALIS) 10 MG tablet Take 0.5 tablets (5 mg total) by mouth daily.   tamsulosin (FLOMAX) 0.4 MG CAPS capsule Take 1 capsule (0.4 mg total) by mouth daily.     Allergies:   Patient has no known allergies.   Social History   Socioeconomic History   Marital status: Married    Spouse name: Not on file   Number of children: Not on file   Years of education: Not on file   Highest education level: Not on file  Occupational History   Occupation: Ecologist: [employer]  Tobacco Use   Smoking status: Never   Smokeless tobacco: Never  Vaping Use   Vaping Use: Never used  Substance and Sexual Activity   Alcohol use: No   Drug use: No   Sexual activity: Yes    Partners: Female    Birth control/protection: Other-see comments    Comment: Vasectomy  Other Topics Concern   Not on file  Social History Narrative   Not on file   Social Determinants of Health   Financial Resource Strain: Not on file  Food Insecurity: Not on file  Transportation Needs: Not on file  Physical Activity: Not on file  Stress: Not on file  Social Connections: Not on file     Family History: The patient's family history includes High blood pressure in his father; Skin cancer in his mother. There is no history of CAD, Colon cancer, Colon polyps, Esophageal cancer, Rectal cancer, or Stomach cancer.  ROS:   Please see the history of present illness.     All other systems reviewed and are negative.  EKGs/Labs/Other Studies Reviewed:    The following studies were reviewed today:   EKG:    June 11, 2021: Sinus bradycardia 56.  No ST or T wave changes.  Recent Labs: 04/23/2021: ALT 34 06/08/2021: BUN 13; Creatinine, Ser 1.13; Hemoglobin 15.1; Magnesium 2.0; Platelets 166; Potassium 3.7; Sodium 135; TSH 0.672  Recent Lipid Panel    Component Value Date/Time   CHOL 212 (H) 04/23/2021 1033   TRIG 112 04/23/2021 1033   HDL 67 04/23/2021 1033   CHOLHDL 3.2 04/23/2021 1033    VLDL 18 09/12/2015 0120   LDLCALC 123 (H) 04/23/2021 1033    Physical Exam:    Physical Exam: Blood pressure 112/68, pulse (!) 56, height 6' (1.829 m), weight 176 lb (79.8 kg), SpO2 99 %.  GEN:  Well nourished, well developed in no acute distress HEENT: Normal NECK: No JVD; No carotid bruits LYMPHATICS: No lymphadenopathy CARDIAC: RRR , no murmurs, rubs, gallops RESPIRATORY:  Clear to auscultation without rales, wheezing or rhonchi  ABDOMEN: Soft, non-tender, non-distended MUSCULOSKELETAL:  No edema;  No deformity  SKIN: Warm and dry NEUROLOGIC:  Alert and oriented x 3     ASSESSMENT:    No diagnosis found. PLAN:       Supraventricular tachycardia:    Echo presents today for follow-up of his episode of SVT over this past weekend.  He received his second Shingrix vaccine and developed a fever of 102 F.  Following this he developed recurrent episodes of supraventricular tachycardia.  He tried his home doses of propranolol but this did not seem to help.  He presented to the emergency room.  He received several doses of amiodarone which converted him back to sinus rhythm but on several occasions he went back into SVT very shortly thereafter.  He was loaded with metoprolol and eventually he converted to sinus rhythm and stayed in sinus rhythm.  He was sent home on Toprol-XL 25 mg twice a day.  He is noticed that he is fairly sluggish and has a generalized lack of energy.  We will reduce the Toprol-XL to 25 mg a day.  We will have him continue to take propranolol but increase his as needed dose to 20 mg every 6 hours on an as-needed basis for recurrent palpitations.  I will see him again in 1 year.  Medication Adjustments/Labs and Tests Ordered: Current medicines are reviewed at length with the patient today.  Concerns regarding medicines are outlined above.  No orders of the defined types were placed in this encounter.  No orders of the defined types were placed in this  encounter.   There are no Patient Instructions on file for this visit.   Signed, Mertie Moores, MD  06/11/2021 9:10 AM    [place]

## 2021-06-11 NOTE — Patient Instructions (Signed)
Medication Instructions:  1) DECREASE Metoprolol Succinate to '25mg'$  once daily 2) START Propranolol '20mg'$ - take one tablet by mouth every 6 hours as needed for palpitations  *If you need a refill on your cardiac medications before your next appointment, please call your pharmacy*   Lab Work: None If you have labs (blood work) drawn today and your tests are completely normal, you will receive your results only by: [place] (if you have MyChart) OR A paper copy in the mail If you have any lab test that is abnormal or we need to change your treatment, we will call you to review the results.   Testing/Procedures: None   Follow-Up: At [facility], you and your health needs are our priority.  As part of our continuing mission to provide you with exceptional heart care, we have created designated Provider Care Teams.  These Care Teams include your primary Cardiologist (physician) and Advanced Practice Providers (APPs -  Physician Assistants and Nurse Practitioners) who all work together to provide you with the care you need, when you need it.  We recommend signing up for the patient portal called "MyChart".  Sign up information is provided on this After Visit Summary.  MyChart is used to connect with patients for Virtual Visits (Telemedicine).  Patients are able to view lab/test results, encounter notes, upcoming appointments, etc.  Non-urgent messages can be sent to your provider as well.   To learn more about what you can do with MyChart, go to [email or web address].    Your next appointment:   1 year(s)  The format for your next appointment:   In Person  Provider:   You may see Mertie Moores, MD or one of the following Advanced Practice Providers on your designated Care Team:   Richardson Dopp, PA-C Robbie Lis, [place]   Other Instructions

## 2021-06-21 DIAGNOSIS — G4733 Obstructive sleep apnea (adult) (pediatric): Secondary | ICD-10-CM | POA: Diagnosis not present

## 2021-07-03 NOTE — Telephone Encounter (Signed)
Error

## 2021-07-17 ENCOUNTER — Encounter: Payer: Self-pay | Admitting: Gastroenterology

## 2021-07-19 DIAGNOSIS — N401 Enlarged prostate with lower urinary tract symptoms: Secondary | ICD-10-CM | POA: Diagnosis not present

## 2021-07-19 DIAGNOSIS — N411 Chronic prostatitis: Secondary | ICD-10-CM | POA: Diagnosis not present

## 2021-07-19 DIAGNOSIS — R351 Nocturia: Secondary | ICD-10-CM | POA: Diagnosis not present

## 2021-07-19 DIAGNOSIS — R350 Frequency of micturition: Secondary | ICD-10-CM | POA: Diagnosis not present

## 2021-07-19 DIAGNOSIS — R3915 Urgency of urination: Secondary | ICD-10-CM | POA: Diagnosis not present

## 2021-07-19 DIAGNOSIS — R3912 Poor urinary stream: Secondary | ICD-10-CM | POA: Diagnosis not present

## 2021-07-21 DIAGNOSIS — G4733 Obstructive sleep apnea (adult) (pediatric): Secondary | ICD-10-CM | POA: Diagnosis not present

## 2021-07-24 DIAGNOSIS — G4440 Drug-induced headache, not elsewhere classified, not intractable: Secondary | ICD-10-CM | POA: Diagnosis not present

## 2021-07-24 DIAGNOSIS — Z9989 Dependence on other enabling machines and devices: Secondary | ICD-10-CM | POA: Diagnosis not present

## 2021-07-24 DIAGNOSIS — G4733 Obstructive sleep apnea (adult) (pediatric): Secondary | ICD-10-CM | POA: Diagnosis not present

## 2021-07-24 DIAGNOSIS — R519 Headache, unspecified: Secondary | ICD-10-CM | POA: Diagnosis not present

## 2021-08-08 DIAGNOSIS — R519 Headache, unspecified: Secondary | ICD-10-CM | POA: Diagnosis not present

## 2021-08-08 DIAGNOSIS — J3489 Other specified disorders of nose and nasal sinuses: Secondary | ICD-10-CM | POA: Diagnosis not present

## 2021-08-12 DIAGNOSIS — G4733 Obstructive sleep apnea (adult) (pediatric): Secondary | ICD-10-CM | POA: Diagnosis not present

## 2021-08-12 DIAGNOSIS — J329 Chronic sinusitis, unspecified: Secondary | ICD-10-CM | POA: Diagnosis not present

## 2021-08-21 DIAGNOSIS — G4733 Obstructive sleep apnea (adult) (pediatric): Secondary | ICD-10-CM | POA: Diagnosis not present

## 2021-08-26 DIAGNOSIS — J329 Chronic sinusitis, unspecified: Secondary | ICD-10-CM | POA: Diagnosis not present

## 2021-09-12 DIAGNOSIS — J329 Chronic sinusitis, unspecified: Secondary | ICD-10-CM | POA: Diagnosis not present

## 2021-09-20 DIAGNOSIS — G4733 Obstructive sleep apnea (adult) (pediatric): Secondary | ICD-10-CM | POA: Diagnosis not present

## 2021-09-24 DIAGNOSIS — J45909 Unspecified asthma, uncomplicated: Secondary | ICD-10-CM | POA: Diagnosis not present

## 2021-09-24 DIAGNOSIS — J329 Chronic sinusitis, unspecified: Secondary | ICD-10-CM | POA: Diagnosis not present

## 2021-10-01 ENCOUNTER — Other Ambulatory Visit: Payer: Self-pay

## 2021-10-01 ENCOUNTER — Encounter: Payer: Self-pay | Admitting: Gastroenterology

## 2021-10-01 ENCOUNTER — Ambulatory Visit (AMBULATORY_SURGERY_CENTER): Payer: BC Managed Care – PPO | Admitting: *Deleted

## 2021-10-01 VITALS — Ht 72.0 in | Wt 180.0 lb

## 2021-10-01 DIAGNOSIS — Z86010 Personal history of colonic polyps: Secondary | ICD-10-CM

## 2021-10-01 MED ORDER — NA SULFATE-K SULFATE-MG SULF 17.5-3.13-1.6 GM/177ML PO SOLN: 1.0000 | Freq: Once | ORAL | 0 refills | Status: AC

## 2021-10-01 NOTE — Progress Notes (Signed)
No egg or soy allergy known to patient  No issues known to pt with past sedation with any surgeries or procedures Patient denies ever being told they had issues or difficulty with intubation  No FH of Malignant Hyperthermia Pt is not on diet pills Pt is not on  home 02  Pt is not on blood thinners  Pt denies issues with constipation  No A fib or A flutter  Pt is fully vaccinated  for Covid   Discussed with pt there will be an out-of-pocket cost for prep and that varies from $0 to 70 +  dollars - pt verbalized understanding   Due to the COVID-19 pandemic we are asking patients to follow certain guidelines in PV and the [place]   Pt aware of COVID protocols and [facility] guidelines   PV completed over the phone. Pt verified name, DOB, address and insurance during PV today.  Pt mailed instruction packet with copy of consent form to read and not return, and instructions.    Pt encouraged to call with questions or issues.  If pt has My chart, procedure instructions sent via My Chart

## 2021-10-09 ENCOUNTER — Ambulatory Visit (AMBULATORY_SURGERY_CENTER): Payer: BC Managed Care – PPO | Admitting: Gastroenterology

## 2021-10-09 ENCOUNTER — Encounter: Payer: Self-pay | Admitting: Gastroenterology

## 2021-10-09 VITALS — BP 114/74 | HR 60 | Temp 98.4°F | Resp 19 | Ht 72.0 in | Wt 180.0 lb

## 2021-10-09 DIAGNOSIS — D125 Benign neoplasm of sigmoid colon: Secondary | ICD-10-CM

## 2021-10-09 DIAGNOSIS — D122 Benign neoplasm of ascending colon: Secondary | ICD-10-CM

## 2021-10-09 DIAGNOSIS — Z86010 Personal history of colonic polyps: Secondary | ICD-10-CM | POA: Diagnosis not present

## 2021-10-09 DIAGNOSIS — K640 First degree hemorrhoids: Secondary | ICD-10-CM

## 2021-10-09 DIAGNOSIS — Z1211 Encounter for screening for malignant neoplasm of colon: Secondary | ICD-10-CM | POA: Diagnosis not present

## 2021-10-09 MED ORDER — SODIUM CHLORIDE 0.9 % IV SOLN: 500.0000 mL | Freq: Once | INTRAVENOUS | Status: DC

## 2021-10-09 NOTE — Op Note (Signed)
[place] Patient Name: Blake Sims Procedure Date: 10/09/2021 2:34 PM MRN: [id number] Endoscopist: Gerrit Heck , MD Age: 60 Referring MD:  Date of Birth: [birth year] Gender: Male Account #: [id number] Procedure:                Colonoscopy Indications:              Surveillance: History of adenomatous polyps,                            inadequate prep on last exam (<7yr)                           Colonoscopy in 06/2020 notable for 5 subcentimeter                            polyps, with pathology demonstrating Tubular                            Adenomas and Sessile Serrated Polyps. The prep was                            fair with retained solid food materials which                            limited views and was recommended to repeat in 6-12                            months for completion of screening/surveillance. He                            is otherwise w/o GI sxs. Medicines:                Monitored Anesthesia Care Procedure:                Pre-Anesthesia Assessment:                           - Prior to the procedure, a History and Physical                            was performed, and patient medications and                            allergies were reviewed. The patient's tolerance of                            previous anesthesia was also reviewed. The risks                            and benefits of the procedure and the sedation                            options and risks were discussed with the patient.  All questions were answered, and informed consent                            was obtained. Prior Anticoagulants: The patient has                            taken no previous anticoagulant or antiplatelet                            agents. ASA Grade Assessment: III - A patient with                            severe systemic disease. After reviewing the risks                            and benefits, the patient was deemed in                             satisfactory condition to undergo the procedure.                           After obtaining informed consent, the colonoscope                            was passed under direct vision. Throughout the                            procedure, the patient's blood pressure, pulse, and                            oxygen saturations were monitored continuously. The                            CF [id number] #[id number] was introduced through the anus                            and advanced to the the terminal ileum. The                            colonoscopy was performed without difficulty. The                            patient tolerated the procedure well. The quality                            of the bowel preparation was good. The terminal                            ileum, ileocecal valve, appendiceal orifice, and                            rectum were photographed. Scope In: 2:39:31 PM Scope Out: 2:59:47 PM Scope Withdrawal Time: 0 hours 16 minutes 36 seconds  Total Procedure  Duration: 0 hours 20 minutes 16 seconds  Findings:                 The perianal and digital rectal examinations were                            normal.                           Three sessile polyps were found in the ascending                            colon. The polyps were 3 to 5 mm in size. These                            polyps were removed with a cold snare. Resection                            and retrieval were complete. Estimated blood loss                            was minimal.                           Two sessile polyps were found in the sigmoid colon.                            The polyps were 3 to 5 mm in size. These polyps                            were removed with a cold snare. Resection and                            retrieval were complete. Estimated blood loss was                            minimal.                           Non-bleeding internal hemorrhoids were found during                             retroflexion. The hemorrhoids were small.                           The terminal ileum appeared normal. Complications:            No immediate complications. Estimated Blood Loss:     Estimated blood loss was minimal. Impression:               - Three 3 to 5 mm polyps in the ascending colon,                            removed with a cold snare. Resected and retrieved.                           -  Two 3 to 5 mm polyps in the sigmoid colon,                            removed with a cold snare. Resected and retrieved.                           - Non-bleeding internal hemorrhoids.                           - The examined portion of the ileum was normal. Recommendation:           - Patient has a contact number available for                            emergencies. The signs and symptoms of potential                            delayed complications were discussed with the                            patient. Return to normal activities tomorrow.                            Written discharge instructions were provided to the                            patient.                           - Resume previous diet.                           - Continue present medications.                           - Await pathology results.                           - Repeat colonoscopy for surveillance based on                            pathology results.                           - Return to GI office PRN.                           - Use fiber, for example Citrucel, Fibercon, Konsyl                            or Metamucil.                           - Internal hemorrhoids were noted on this study and  may be amenable to hemorrhoid band ligation. If you                            are interested in further treatment of these                            hemorrhoids with band ligation, please contact my                            clinic to set up an appointment for evaluation  and                            treatment. Gerrit Heck, MD 10/09/2021 3:06:42 PM

## 2021-10-09 NOTE — Patient Instructions (Signed)
Handouts on polyps, hemorrhoids, high fiber diet, and non-surgical hemorrhoid treatment given to patient.  YOU HAD AN ENDOSCOPIC PROCEDURE TODAY AT [facility]:   Refer to the procedure report that was given to you for any specific questions about what was found during the examination.  If the procedure report does not answer your questions, please call your gastroenterologist to clarify.  If you requested that your care partner not be given the details of your procedure findings, then the procedure report has been included in a sealed envelope for you to review at your convenience later.  YOU SHOULD EXPECT: Some feelings of bloating in the abdomen. Passage of more gas than usual.  Walking can help get rid of the air that was put into your GI tract during the procedure and reduce the bloating. If you had a lower endoscopy (such as a colonoscopy or flexible sigmoidoscopy) you may notice spotting of blood in your stool or on the toilet paper. If you underwent a bowel prep for your procedure, you may not have a normal bowel movement for a few days.  Please Note:  You might notice some irritation and congestion in your nose or some drainage.  This is from the oxygen used during your procedure.  There is no need for concern and it should clear up in a day or so.  SYMPTOMS TO REPORT IMMEDIATELY:  Following lower endoscopy (colonoscopy or flexible sigmoidoscopy):  Excessive amounts of blood in the stool  Significant tenderness or worsening of abdominal pains  Swelling of the abdomen that is new, acute  Fever of 100F or higher   For urgent or emergent issues, a gastroenterologist can be reached at any hour by calling [phone]. Do not use MyChart messaging for urgent concerns.    DIET:  We do recommend a small meal at first, but then you may proceed to your regular diet.  Drink plenty of fluids but you should avoid alcoholic beverages for 24 hours.  ACTIVITY:  You should plan to  take it easy for the rest of today and you should NOT DRIVE or use heavy machinery until tomorrow (because of the sedation medicines used during the test).    FOLLOW UP: Our staff will call the number listed on your records 48-72 hours following your procedure to check on you and address any questions or concerns that you may have regarding the information given to you following your procedure. If we do not reach you, we will leave a message.  We will attempt to reach you two times.  During this call, we will ask if you have developed any symptoms of COVID 19. If you develop any symptoms (ie: fever, flu-like symptoms, shortness of breath, cough etc.) before then, please call [phone].  If you test positive for Covid 19 in the 2 weeks post procedure, please call and report this information to [place].    If any biopsies were taken you will be contacted by phone or by letter within the next 1-3 weeks.  Please call us at [phone] if you have not heard about the biopsies in 3 weeks.    SIGNATURES/CONFIDENTIALITY: You and/or your care partner have signed paperwork which will be entered into your electronic medical record.  These signatures attest to the fact that that the information above on your After Visit Summary has been reviewed and is understood.  Full responsibility of the confidentiality of this discharge information lies with you and/or your care-partner.

## 2021-10-09 NOTE — Progress Notes (Signed)
Called to room to assist during endoscopic procedure.  Patient ID and intended procedure confirmed with present staff. Received instructions for my participation in the procedure from the performing physician.  

## 2021-10-09 NOTE — Progress Notes (Signed)
[facility]  Pt's states no medical or surgical changes since previsit or office visit.

## 2021-10-09 NOTE — Progress Notes (Signed)
To pacu, VSS. Report to [email or web address].tb 

## 2021-10-09 NOTE — Progress Notes (Signed)
GASTROENTEROLOGY PROCEDURE H&P NOTE   Primary Care Physician: Emeterio Reeve, DO    Reason for Procedure:   Colon polyp surveillance  Plan:    Colonoscopy  Patient is appropriate for endoscopic procedure(s) in the ambulatory ([place]) setting.  The nature of the procedure, as well as the risks, benefits, and alternatives were carefully and thoroughly reviewed with the patient. Ample time for discussion and questions allowed. The patient understood, was satisfied, and agreed to proceed.     HPI: Blake Sims is a 60 y.o. male who presents for colonoscopy for ongoing polyp surveillance.   Colonoscopy in 06/2020 notable for 5 subcentimeter polyps, with pathology demonstrating Tubular Adenomas and Sessile Serrated Polyps. The prep was fair with retained solid food materials which limited views and was recommended to repeat in 6-12 months for completion of screening/surveillance. He is otherwise w/o GI sxs.   Past Medical History:  Diagnosis Date   Allergy    Bilateral inguinal hernia without obstruction or gangrene 04/25/2014   Cataract    removed with lens implants , bilateral    Complication of anesthesia    trouble urinating after eye surgery 06   Family history of prostate cancer in father 04/13/2019   GERD (gastroesophageal reflux disease)    OCC- no meds    History of cardiac radiofrequency ablation 04/13/2019   Inguinal hernia    bilateral   OSA (obstructive sleep apnea) 09/12/2015   Rapid palpitations    a. Tachypalps, frog positive 2014 - suspected AVNRT but not documented. b. Recurrent palps 08/2015, again ceased before they could be recorded.   Sleep apnea    wears cpap , no 02    SVT (supraventricular tachycardia) ([place]) 12/14/2017    Past Surgical History:  Procedure Laterality Date   COLONOSCOPY     ~10 yrs ago- [facility]    INGUINAL HERNIA REPAIR Bilateral 06/08/2014   Procedure: LAPAROSCOPIC BILATERAL INGUINAL HERNIA REPAIR ;  Surgeon: Imogene Burn. Georgette Dover, MD;   Location: [place];  Service: General;  Laterality: Bilateral;   INSERTION OF MESH Bilateral 06/08/2014   Procedure: INSERTION OF MESH;  Surgeon: Imogene Burn. Georgette Dover, MD;  Location: [place];  Service: General;  Laterality: Bilateral;   POLYPECTOMY     RETINAL DETACHMENT REPAIR W/ SCLERAL BUCKLE LE Right 2006   SVT ABLATION N/A 12/14/2017   Procedure: SVT ABLATION;  Surgeon: Evans Lance, MD;  Location: [facility];  Service: Cardiovascular;  Laterality: N/A;   VASECTOMY  2011    Prior to Admission medications   Medication Sig Start Date End Date Taking? Authorizing Provider  Multiple Vitamin (MULTIVITAMIN WITH MINERALS) TABS tablet Take 1 tablet by mouth daily.   Yes [provider]  tamsulosin (FLOMAX) 0.4 MG CAPS capsule Take 1 capsule (0.4 mg total) by mouth daily. 04/23/21  Yes Emeterio Reeve, DO  baclofen (LIORESAL) 10 MG tablet Take 10 mg by mouth 3 (three) times daily as needed. Patient not taking: Reported on 10/09/2021 04/18/21   [provider]  budesonide (PULMICORT) 0.5 MG/2ML nebulizer solution Take by nebulization. Patient not taking: Reported on 10/09/2021 09/25/21   [provider]  metoprolol succinate (TOPROL-XL) 25 MG 24 hr tablet Take 1 tablet (25 mg total) by mouth daily. Patient not taking: Reported on 10/09/2021 06/11/21   Nahser, Wonda Cheng, MD  nortriptyline (PAMELOR) 10 MG capsule Take 10 mg by mouth at bedtime. Patient not taking: Reported on 10/09/2021 04/18/21   [provider]  propranolol (INDERAL) 20 MG  tablet Take one tablet by mouth every 6 hours as needed for palpitations Patient not taking: Reported on 10/01/2021 06/11/21   Nahser, Wonda Cheng, MD  tadalafil (CIALIS) 10 MG tablet Take 0.5 tablets (5 mg total) by mouth daily. Patient not taking: Reported on 10/09/2021 04/23/21   Emeterio Reeve, DO    Current Outpatient Medications  Medication Sig Dispense Refill   Multiple Vitamin (MULTIVITAMIN WITH MINERALS) TABS  tablet Take 1 tablet by mouth daily.     tamsulosin (FLOMAX) 0.4 MG CAPS capsule Take 1 capsule (0.4 mg total) by mouth daily. 90 capsule 3   baclofen (LIORESAL) 10 MG tablet Take 10 mg by mouth 3 (three) times daily as needed. (Patient not taking: Reported on 10/09/2021)     budesonide (PULMICORT) 0.5 MG/2ML nebulizer solution Take by nebulization. (Patient not taking: Reported on 10/09/2021)     metoprolol succinate (TOPROL-XL) 25 MG 24 hr tablet Take 1 tablet (25 mg total) by mouth daily. (Patient not taking: Reported on 10/09/2021) 90 tablet 3   nortriptyline (PAMELOR) 10 MG capsule Take 10 mg by mouth at bedtime. (Patient not taking: Reported on 10/09/2021)     propranolol (INDERAL) 20 MG tablet Take one tablet by mouth every 6 hours as needed for palpitations (Patient not taking: Reported on 10/01/2021) 90 tablet 3   tadalafil (CIALIS) 10 MG tablet Take 0.5 tablets (5 mg total) by mouth daily. (Patient not taking: Reported on 10/09/2021) 90 tablet 3   Current Facility-Administered Medications  Medication Dose Route Frequency Provider Last Rate Last Admin   0.9 %  sodium chloride infusion  500 mL Intravenous Once Renu Asby V, DO        Allergies as of 10/09/2021   (No Known Allergies)    Family History  Problem Relation Age of Onset   Skin cancer Mother    High blood pressure Father    CAD Neg Hx        neg hx   Colon cancer Neg Hx    Colon polyps Neg Hx    Esophageal cancer Neg Hx    Rectal cancer Neg Hx    Stomach cancer Neg Hx     Social History   Socioeconomic History   Marital status: Married    Spouse name: Not on file   Number of children: Not on file   Years of education: Not on file   Highest education level: Not on file  Occupational History   Occupation: Ecologist: [employer]  Tobacco Use   Smoking status: Never   Smokeless tobacco: Never  Vaping Use   Vaping Use: Never used  Substance and Sexual Activity   Alcohol use: No    Drug use: No   Sexual activity: Yes    Partners: Female    Birth control/protection: Other-see comments    Comment: Vasectomy  Other Topics Concern   Not on file  Social History Narrative   Not on file   Social Determinants of Health   Financial Resource Strain: Not on file  Food Insecurity: Not on file  Transportation Needs: Not on file  Physical Activity: Not on file  Stress: Not on file  Social Connections: Not on file  Intimate Partner Violence: Not on file    Physical Exam: Vital signs in last 24 hours: @BP  127/81    Pulse 78    Temp 98.4 F (36.9 C)    Ht 6' (1.829 m)    Wt 180 lb (81.6 kg)  SpO2 99%    BMI 24.41 kg/m  GEN: NAD EYE: Sclerae anicteric ENT: MMM CV: Non-tachycardic Pulm: CTA b/l GI: Soft, NT/ND NEURO:  Alert & Oriented x 3   Gerrit Heck, DO [facility]   10/09/2021 2:31 PM

## 2021-10-11 ENCOUNTER — Telehealth: Payer: Self-pay | Admitting: *Deleted

## 2021-10-11 NOTE — Telephone Encounter (Signed)
°  Follow up Call-  Call back number 10/09/2021 06/29/2020  Post procedure Call Back phone  # [phone]  Permission to leave phone message Yes Yes  Some recent data might be hidden     Patient questions:  Do you have a fever, pain , or abdominal swelling? No. Pain Score  0 *  Have you tolerated food without any problems? Yes.    Have you been able to return to your normal activities? Yes.    Do you have any questions about your discharge instructions: Diet   No. Medications  No. Follow up visit  No.  Do you have questions or concerns about your Care? No.  Actions: * If pain score is 4 or above: No action needed, pain <4.  Have you developed a fever since your procedure? no  2.   Have you had an respiratory symptoms (SOB or cough) since your procedure? no  3.   Have you tested positive for COVID 19 since your procedure no  4.   Have you had any family members/close contacts diagnosed with the COVID 19 since your procedure?  no   If yes to any of these questions please route to Joylene John, RN and Joella Prince, RN

## 2021-10-15 DIAGNOSIS — R350 Frequency of micturition: Secondary | ICD-10-CM | POA: Diagnosis not present

## 2021-10-15 DIAGNOSIS — N401 Enlarged prostate with lower urinary tract symptoms: Secondary | ICD-10-CM | POA: Diagnosis not present

## 2021-10-17 ENCOUNTER — Telehealth: Payer: Self-pay | Admitting: Gastroenterology

## 2021-10-17 NOTE — Telephone Encounter (Signed)
Called and spoke with patient. Pt wanted to proceed with scheduling his 1st hemorrhoid banding. He has been scheduled for his 1st hem band with Dr. Bryan Lemma on Wednesday, 11/06/21 at 3 pm. Pt is aware that this appt is at the [facility] office. Pt verbalized understanding and had no concerns at the end of the call.

## 2021-10-17 NOTE — Telephone Encounter (Signed)
Inbound call from patient requesting a call back to discuss possible banding procedure

## 2021-10-22 DIAGNOSIS — N411 Chronic prostatitis: Secondary | ICD-10-CM | POA: Diagnosis not present

## 2021-10-22 DIAGNOSIS — N401 Enlarged prostate with lower urinary tract symptoms: Secondary | ICD-10-CM | POA: Diagnosis not present

## 2021-10-22 DIAGNOSIS — R351 Nocturia: Secondary | ICD-10-CM | POA: Diagnosis not present

## 2021-10-22 DIAGNOSIS — R350 Frequency of micturition: Secondary | ICD-10-CM | POA: Diagnosis not present

## 2021-10-24 ENCOUNTER — Encounter: Payer: Self-pay | Admitting: Gastroenterology

## 2021-10-30 DIAGNOSIS — Z9889 Other specified postprocedural states: Secondary | ICD-10-CM | POA: Diagnosis not present

## 2021-10-30 DIAGNOSIS — J329 Chronic sinusitis, unspecified: Secondary | ICD-10-CM | POA: Diagnosis not present

## 2021-11-06 ENCOUNTER — Other Ambulatory Visit: Payer: Self-pay

## 2021-11-06 ENCOUNTER — Encounter: Payer: Self-pay | Admitting: Gastroenterology

## 2021-11-06 ENCOUNTER — Ambulatory Visit: Payer: BC Managed Care – PPO | Admitting: Gastroenterology

## 2021-11-06 VITALS — BP 110/76 | HR 67 | Ht 72.0 in | Wt 188.1 lb

## 2021-11-06 DIAGNOSIS — Z86010 Personal history of colonic polyps: Secondary | ICD-10-CM

## 2021-11-06 DIAGNOSIS — K641 Second degree hemorrhoids: Secondary | ICD-10-CM

## 2021-11-06 NOTE — Progress Notes (Signed)
Chief Complaint:    Symptomatic Internal Hemorrhoids; Hemorrhoid Band Ligation  GI History: 61 year old male with medical history as outlined below, follows in the GI clinic for history of colon polyps and symptomatic internal hemorrhoids.  - 11/06/2021: Presents for hemorrhoid banding #1  Endoscopic History: - Colonoscopy (06/2020): 5 subcentimeter polyps, with pathology demonstrating Tubular Adenomas and Sessile Serrated Polyps. The prep was fair with retained solid food materials.  Repeat 6-12 months - Colonoscopy (09/2021):3 polyps in ascending colon (path: TA x2), 2 polyps in sigmoid colon (path: TA x1, inflammatory polyp x1), internal hemorrhoids.  Normal TI.  Repeat in 3 years  HPI:     Patient is a 61 y.o. malewith a history of symptomatic internal hemorrhoids presenting to the Gastroenterology Clinic for follow-up and ongoing treatment. The patient presents with symptomatic grade 2 hemorrhoids, unresponsive to maximal medical therapy, requesting rubber band ligation of symptomatic hemorrhoidal disease.  No change in medical or surgical history, medications, allergies, social history since last appointment with me.   Review of systems:     No chest pain, no SOB, no fevers, no urinary sx   Past Medical History:  Diagnosis Date   Allergy    Bilateral inguinal hernia without obstruction or gangrene 04/25/2014   Cataract    removed with lens implants , bilateral    Complication of anesthesia    trouble urinating after eye surgery 06   Family history of prostate cancer in father 04/13/2019   GERD (gastroesophageal reflux disease)    OCC- no meds    History of cardiac radiofrequency ablation 04/13/2019   Inguinal hernia    bilateral   OSA (obstructive sleep apnea) 09/12/2015   Rapid palpitations    a. Tachypalps, frog positive 2014 - suspected AVNRT but not documented. b. Recurrent palps 08/2015, again ceased before they could be recorded.   Sleep apnea    wears cpap , no 02     SVT (supraventricular tachycardia) ([place]) 12/14/2017    Patient's surgical history, family medical history, social history, medications and allergies were all reviewed in Epic    Current Outpatient Medications  Medication Sig Dispense Refill   baclofen (LIORESAL) 10 MG tablet Take 10 mg by mouth 3 (three) times daily as needed.     metoprolol succinate (TOPROL-XL) 25 MG 24 hr tablet Take 1 tablet (25 mg total) by mouth daily. (Patient taking differently: Take 25 mg by mouth as needed.) 90 tablet 3   Multiple Vitamin (MULTIVITAMIN WITH MINERALS) TABS tablet Take 1 tablet by mouth daily.     tadalafil (CIALIS) 10 MG tablet Take 0.5 tablets (5 mg total) by mouth daily. (Patient taking differently: Take 5 mg by mouth as needed.) 90 tablet 3   tamsulosin (FLOMAX) 0.4 MG CAPS capsule Take 1 capsule (0.4 mg total) by mouth daily. 90 capsule 3   No current facility-administered medications for this visit.    Physical Exam:     BP 110/76    Pulse 67    Ht 6' (1.829 m)    Wt 188 lb 2 oz (85.3 kg)    BMI 25.51 kg/m   GENERAL:  Pleasant male in NAD PSYCH: : Cooperative, normal affect NEURO: Alert and oriented x 3, no focal neurologic deficits Rectal exam: Sensation intact and preserved anal wink.  Grade 2 hemorrhoids noted in all positions on anoscopy.  No external anal fissures noted. Normal sphincter tone. No palpable mass. No blood on the exam glove. (Chaperone: Curlene Labrum, CMA).   IMPRESSION and  PLAN:    #1.  Symptomatic internal hemorrhoids: PROCEDURE NOTE: The patient presents with symptomatic grade 2 hemorrhoids, unresponsive to maximal medical therapy, requesting rubber band ligation of symptomatic hemorrhoidal disease.  All risks, benefits and alternative forms of therapy were described and informed consent was obtained.  In the Left Lateral Decubitus position, anoscopic examination revealed grade 2 hemorrhoids in the all position(s).  The anorectum was pre-medicated with  RectiCare. The decision was made to band the LL internal hemorrhoid, and the [place] was used to perform band ligation without complication.  Digital anorectal examination was then performed to assure proper positioning of the band, and to adjust the banded tissue as required.  The patient was discharged home without pain or other issues.  Dietary and behavioral recommendations were given and along with follow-up instructions.     The following adjunctive treatments were recommended:  -Resume high-fiber diet with fiber supplement (i.e. Citrucel or Benefiber) with goal for soft stools without straining to have a BM. -Resume adequate fluid intake.  The patient will return in 4 for  follow-up and possible additional banding as required. No complications were encountered and the patient tolerated the procedure well.    #2.  History of colon polyps - Repeat colonoscopy in 2026 for ongoing polyp surveillance      Lavena Bullion ,DO, FACG 11/06/2021, 3:32 PM

## 2021-11-06 NOTE — Patient Instructions (Addendum)
If you are age 62 or older, your body mass index should be between 23-30. Your There is no height or weight on file to calculate BMI. If this is out of the aforementioned range listed, please consider follow up with your Primary Care Provider.  If you are age 36 or younger, your body mass index should be between 19-25. Your There is no height or weight on file to calculate BMI. If this is out of the aformentioned range listed, please consider follow up with your Primary Care Provider.   __________________________________________________________  The [facility] providers would like to encourage you to use [facility] to communicate with providers for non-urgent requests or questions.  Due to long hold times on the telephone, sending your provider a message by [facility] may be a faster and more efficient way to get a response.  Please allow 48 business hours for a response.  Please remember that this is for non-urgent requests.   Due to recent changes in healthcare laws, you may see the results of your imaging and laboratory studies on MyChart before your provider has had a chance to review them.  We understand that in some cases there may be results that are confusing or concerning to you. Not all laboratory results come back in the same time frame and the provider may be waiting for multiple results in order to interpret others.  Please give [place] 48 hours in order for your provider to thoroughly review all the results before contacting the office for clarification of your results.   HEMORRHOID BANDING PROCEDURE    FOLLOW-UP CARE   The procedure you have had should have been relatively painless since the banding of the area involved does not have nerve endings and there is no pain sensation.  The rubber band cuts off the blood supply to the hemorrhoid and the band may fall off as soon as 48 hours after the banding (the band may occasionally be seen in the toilet bowl following a bowel movement). You may notice  a temporary feeling of fullness in the rectum which should respond adequately to plain Tylenol or Motrin.  Following the banding, avoid strenuous exercise that evening and resume full activity the next day.  A sitz bath (soaking in a warm tub) or bidet is soothing, and can be useful for cleansing the area after bowel movements.     To avoid constipation, take two tablespoons of natural wheat bran, natural oat bran, flax, Benefiber or any over the counter fiber supplement and increase your water intake to 7-8 glasses daily.    Unless you have been prescribed anorectal medication, do not put anything inside your rectum for two weeks: No suppositories, enemas, fingers, etc.  Occasionally, you may have more bleeding than usual after the banding procedure.  This is often from the untreated hemorrhoids rather than the treated one.  Dont be concerned if there is a tablespoon or so of blood.  If there is more blood than this, lie flat with your bottom higher than your head and apply an ice pack to the area. If the bleeding does not stop within a half an hour or if you feel faint, call our office at [phone] or go to the emergency room.  Problems are not common; however, if there is a substantial amount of bleeding, severe pain, chills, fever or difficulty passing urine (very rare) or other problems, you should call us at [phone] or report to the nearest emergency room.  Do  not stay seated continuously for more than 2-3 hours for a day or two after the procedure.  Tighten your buttock muscles 10-15 times every two hours and take 10-15 deep breaths every 1-2 hours.  Do not spend more than a few minutes on the toilet if you cannot empty your bowel; instead re-visit the toilet at a later time.  Thank you for choosing me and [facility].  Vito Cirigliano, D.O.

## 2021-11-14 DIAGNOSIS — R972 Elevated prostate specific antigen [PSA]: Secondary | ICD-10-CM | POA: Diagnosis not present

## 2021-11-19 DIAGNOSIS — H31092 Other chorioretinal scars, left eye: Secondary | ICD-10-CM | POA: Diagnosis not present

## 2021-11-19 DIAGNOSIS — H35341 Macular cyst, hole, or pseudohole, right eye: Secondary | ICD-10-CM | POA: Diagnosis not present

## 2021-11-19 DIAGNOSIS — H35371 Puckering of macula, right eye: Secondary | ICD-10-CM | POA: Diagnosis not present

## 2021-11-19 DIAGNOSIS — H43813 Vitreous degeneration, bilateral: Secondary | ICD-10-CM | POA: Diagnosis not present

## 2021-11-20 DIAGNOSIS — R972 Elevated prostate specific antigen [PSA]: Secondary | ICD-10-CM | POA: Diagnosis not present

## 2021-11-20 DIAGNOSIS — R102 Pelvic and perineal pain: Secondary | ICD-10-CM | POA: Diagnosis not present

## 2021-11-20 DIAGNOSIS — R351 Nocturia: Secondary | ICD-10-CM | POA: Diagnosis not present

## 2021-11-20 DIAGNOSIS — R3916 Straining to void: Secondary | ICD-10-CM | POA: Diagnosis not present

## 2021-11-20 DIAGNOSIS — N411 Chronic prostatitis: Secondary | ICD-10-CM | POA: Diagnosis not present

## 2021-11-20 DIAGNOSIS — N403 Nodular prostate with lower urinary tract symptoms: Secondary | ICD-10-CM | POA: Diagnosis not present

## 2021-12-03 ENCOUNTER — Encounter: Payer: Self-pay | Admitting: Gastroenterology

## 2021-12-03 ENCOUNTER — Other Ambulatory Visit: Payer: Self-pay

## 2021-12-03 ENCOUNTER — Ambulatory Visit: Payer: BC Managed Care – PPO | Admitting: Gastroenterology

## 2021-12-03 VITALS — BP 116/70 | HR 65 | Ht 72.0 in | Wt 184.5 lb

## 2021-12-03 DIAGNOSIS — K641 Second degree hemorrhoids: Secondary | ICD-10-CM

## 2021-12-03 DIAGNOSIS — K649 Unspecified hemorrhoids: Secondary | ICD-10-CM

## 2021-12-03 DIAGNOSIS — Z86010 Personal history of colonic polyps: Secondary | ICD-10-CM

## 2021-12-03 NOTE — Patient Instructions (Addendum)
If you are age 62 or older, your body mass index should be between 23-30. Your Body mass index is 25.02 kg/m. If this is out of the aforementioned range listed, please consider follow up with your Primary Care Provider.  If you are age 27 or younger, your body mass index should be between 19-25. Your Body mass index is 25.02 kg/m. If this is out of the aformentioned range listed, please consider follow up with your Primary Care Provider.   ________________________________________________________  The [place] providers would like to encourage you to use [facility] to communicate with providers for non-urgent requests or questions.  Due to long hold times on the telephone, sending your provider a message by [facility] may be a faster and more efficient way to get a response.  Please allow 48 business hours for a response.  Please remember that this is for non-urgent requests.  _______________________________________________________  Blake Sims PROCEDURE    FOLLOW-UP CARE   The procedure you have had should have been relatively painless since the banding of the area involved does not have nerve endings and there is no pain sensation.  The rubber band cuts off the blood supply to the hemorrhoid and the band may fall off as soon as 48 hours after the banding (the band may occasionally be seen in the toilet bowl following a bowel movement). You may notice a temporary feeling of fullness in the rectum which should respond adequately to plain Tylenol or Motrin.  Following the banding, avoid strenuous exercise that evening and resume full activity the next day.  A sitz bath (soaking in a warm tub) or bidet is soothing, and can be useful for cleansing the area after bowel movements.     To avoid constipation, take two tablespoons of natural wheat bran, natural oat bran, flax, Benefiber or any over the counter fiber supplement and increase your water intake to 7-8 glasses daily.    Unless you  have been prescribed anorectal medication, do not put anything inside your rectum for two weeks: No suppositories, enemas, fingers, etc.  Occasionally, you may have more bleeding than usual after the banding procedure.  This is often from the untreated hemorrhoids rather than the treated one.  Dont be concerned if there is a tablespoon or so of blood.  If there is more blood than this, lie flat with your bottom higher than your head and apply an ice pack to the area. If the bleeding does not stop within a half an hour or if you feel faint, call our office at [phone] or go to the emergency room.  Problems are not common; however, if there is a substantial amount of bleeding, severe pain, chills, fever or difficulty passing urine (very rare) or other problems, you should call us at [phone] or report to the nearest emergency room.  Do not stay seated continuously for more than 2-3 hours for a day or two after the procedure.  Tighten your buttock muscles 10-15 times every two hours and take 10-15 deep breaths every 1-2 hours.  Do not spend more than a few minutes on the toilet if you cannot empty your bowel; instead re-visit the toilet at a later time.   01-07-2022 at 3:40 for next banding. Call with any questions  It was a pleasure to see you today!  Blake Sims, D.O.

## 2021-12-03 NOTE — Progress Notes (Signed)
Chief Complaint:    Symptomatic Internal Hemorrhoids; Hemorrhoid Band Ligation  GI History:61 year old male with medical history as outlined below, follows in the GI clinic for history of colon polyps and symptomatic internal hemorrhoids.  -11/06/2021: Banding of LL hemorrhoid -12/03/2021: Presents for hemorrhoid banding #2  Endoscopic History: - Colonoscopy (06/2020): 5 subcentimeter polyps, with pathology demonstrating Tubular Adenomas and Sessile Serrated Polyps. The prep was fair with retained solid food materials.  Repeat 6-12 months - Colonoscopy (09/2021):3 polyps in ascending colon (path: TA x2), 2 polyps in sigmoid colon (path: TA x1, inflammatory polyp x1), internal hemorrhoids.  Normal TI.  Repeat in 3 years  HPI:     Patient is a 61 y.o. malewith a history of symptomatic internal hemorrhoids presenting to the Gastroenterology Clinic for follow-up and ongoing treatment. The patient presents with symptomatic grade 2 hemorrhoids, unresponsive to maximal medical therapy, requesting rubber band ligation of symptomatic hemorrhoidal disease.  No issue with first hemorrhoid banding.  No change in medical or surgical history, medications, allergies, social history since last appointment with me.   Review of systems:     No chest pain, no SOB, no fevers, no urinary sx   Past Medical History:  Diagnosis Date   Allergy    Bilateral inguinal hernia without obstruction or gangrene 04/25/2014   Cataract    removed with lens implants , bilateral    Complication of anesthesia    trouble urinating after eye surgery 06   Family history of prostate cancer in father 04/13/2019   GERD (gastroesophageal reflux disease)    OCC- no meds    History of cardiac radiofrequency ablation 04/13/2019   Inguinal hernia    bilateral   OSA (obstructive sleep apnea) 09/12/2015   Rapid palpitations    a. Tachypalps, frog positive 2014 - suspected AVNRT but not documented. b. Recurrent palps 08/2015, again  ceased before they could be recorded.   Sleep apnea    wears cpap , no 02    SVT (supraventricular tachycardia) ([facility]) 12/14/2017    Patient's surgical history, family medical history, social history, medications and allergies were all reviewed in Epic    Current Outpatient Medications  Medication Sig Dispense Refill   baclofen (LIORESAL) 10 MG tablet Take 10 mg by mouth 3 (three) times daily as needed.     metoprolol succinate (TOPROL-XL) 25 MG 24 hr tablet Take 1 tablet (25 mg total) by mouth daily. (Patient taking differently: Take 25 mg by mouth as needed.) 90 tablet 3   Multiple Vitamin (MULTIVITAMIN WITH MINERALS) TABS tablet Take 1 tablet by mouth daily.     tadalafil (CIALIS) 10 MG tablet Take 0.5 tablets (5 mg total) by mouth daily. (Patient taking differently: Take 5 mg by mouth as needed.) 90 tablet 3   tamsulosin (FLOMAX) 0.4 MG CAPS capsule Take 1 capsule (0.4 mg total) by mouth daily. 90 capsule 3   No current facility-administered medications for this visit.    Physical Exam:     There were no vitals taken for this visit.  GENERAL:  Pleasant male in NAD PSYCH: : Cooperative, normal affect NEURO: Alert and oriented x 3, no focal neurologic deficits Rectal exam: Sensation intact and preserved anal wink.  Grade 2 hemorrhoids noted in RA/RP positions.  No external anal fissures noted. Normal sphincter tone. No palpable mass. No blood on the exam glove. (Chaperone: Renee Rival, CMA).   IMPRESSION and PLAN:    #1.  Symptomatic internal hemorrhoids: PROCEDURE NOTE: The patient presents with  symptomatic grade 2 hemorrhoids, unresponsive to maximal medical therapy, requesting rubber band ligation of symptomatic hemorrhoidal disease.  All risks, benefits and alternative forms of therapy were described and informed consent was obtained.  In the Left Lateral Decubitus position, anoscopic examination revealed grade 2 hemorrhoids in the RA/RP position(s).  The anorectum was  pre-medicated with RectiCare. The decision was made to band the RP internal hemorrhoid, and the [place] was used to perform band ligation without complication.  Digital anorectal examination was then performed to assure proper positioning of the band, and to adjust the banded tissue as required.  The patient was discharged home without pain or other issues.  Dietary and behavioral recommendations were given and along with follow-up instructions.     The following adjunctive treatments were recommended:  -Resume high-fiber diet with fiber supplement (i.e. Citrucel or Benefiber) with goal for soft stools without straining to have a BM. -Resume adequate fluid intake.  The patient will return in 4 for  follow-up and possible additional banding as required. No complications were encountered and the patient tolerated the procedure well.      #2.   History of colon polyps - Repeat colonoscopy in 2026 for ongoing polyp surveillance      Lavena Bullion ,DO, FACG 12/03/2021, 2:57 PM

## 2021-12-20 DIAGNOSIS — I471 Supraventricular tachycardia: Secondary | ICD-10-CM | POA: Diagnosis not present

## 2021-12-20 DIAGNOSIS — Z0000 Encounter for general adult medical examination without abnormal findings: Secondary | ICD-10-CM | POA: Diagnosis not present

## 2021-12-20 DIAGNOSIS — Z1329 Encounter for screening for other suspected endocrine disorder: Secondary | ICD-10-CM | POA: Diagnosis not present

## 2021-12-20 DIAGNOSIS — Z13220 Encounter for screening for lipoid disorders: Secondary | ICD-10-CM | POA: Diagnosis not present

## 2021-12-20 DIAGNOSIS — G4730 Sleep apnea, unspecified: Secondary | ICD-10-CM | POA: Diagnosis not present

## 2021-12-20 DIAGNOSIS — Z79899 Other long term (current) drug therapy: Secondary | ICD-10-CM | POA: Diagnosis not present

## 2021-12-20 DIAGNOSIS — N400 Enlarged prostate without lower urinary tract symptoms: Secondary | ICD-10-CM | POA: Diagnosis not present

## 2021-12-26 DIAGNOSIS — N401 Enlarged prostate with lower urinary tract symptoms: Secondary | ICD-10-CM | POA: Diagnosis not present

## 2021-12-26 DIAGNOSIS — R351 Nocturia: Secondary | ICD-10-CM | POA: Diagnosis not present

## 2021-12-26 DIAGNOSIS — R350 Frequency of micturition: Secondary | ICD-10-CM | POA: Diagnosis not present

## 2021-12-26 DIAGNOSIS — R3916 Straining to void: Secondary | ICD-10-CM | POA: Diagnosis not present

## 2022-01-07 ENCOUNTER — Other Ambulatory Visit: Payer: Self-pay

## 2022-01-07 ENCOUNTER — Encounter: Payer: Self-pay | Admitting: Gastroenterology

## 2022-01-07 ENCOUNTER — Ambulatory Visit: Payer: BC Managed Care – PPO | Admitting: Gastroenterology

## 2022-01-07 VITALS — BP 110/80 | HR 56 | Ht 72.0 in | Wt 184.0 lb

## 2022-01-07 DIAGNOSIS — K641 Second degree hemorrhoids: Secondary | ICD-10-CM

## 2022-01-07 DIAGNOSIS — K649 Unspecified hemorrhoids: Secondary | ICD-10-CM

## 2022-01-07 NOTE — Progress Notes (Signed)
? ?Chief Complaint:    Symptomatic Internal Hemorrhoids; Hemorrhoid Band Ligation ? ?GI History: 61 year old male with medical history as outlined below, follows in the GI clinic for history of colon polyps and symptomatic internal hemorrhoids. ?  ?-11/06/2021: Banding of LL hemorrhoid ?-12/03/2021: Banding of RP hemorrhoid ?-12/30/2021: Presents for hemorrhoid banding #3 ? ?Endoscopic History: ?- Colonoscopy (06/2020): 5 subcentimeter polyps, with pathology demonstrating Tubular Adenomas and Sessile Serrated Polyps. The prep was fair with retained solid food materials.  Repeat 6-12 months ?- Colonoscopy (09/2021):3 polyps in ascending colon (path: TA x2), 2 polyps in sigmoid colon (path: TA x1, inflammatory polyp x1), internal hemorrhoids.  Normal TI.  Repeat in 3 years ? ?HPI:   ? ? ?Patient is a 61 y.o. malewith a history of symptomatic internal hemorrhoids presenting to the Gastroenterology Clinic for follow-up and ongoing treatment. The patient presents with symptomatic grade 2 hemorrhoids, unresponsive to maximal medical therapy, requesting rubber band ligation of symptomatic hemorrhoidal disease. ? ?Did well with each of the first 2 hemorrhoid bandings.  Still with some intermittent itching/irritation. ? ?No change in medical or surgical history, medications, allergies, social history since last appointment with me. ? ? ?Review of systems:     No chest pain, no SOB, no fevers, no urinary sx  ? ?Past Medical History:  ?Diagnosis Date  ? Allergy   ? Bilateral inguinal hernia without obstruction or gangrene 04/25/2014  ? Cataract   ? removed with lens implants , bilateral   ? Complication of anesthesia   ? trouble urinating after eye surgery 06  ? Family history of prostate cancer in father 04/13/2019  ? GERD (gastroesophageal reflux disease)   ? OCC- no meds   ? History of cardiac radiofrequency ablation 04/13/2019  ? Inguinal hernia   ? bilateral  ? OSA (obstructive sleep apnea) 09/12/2015  ? Rapid palpitations   ? a.  Tachypalps, frog positive 2014 - suspected AVNRT but not documented. b. Recurrent palps 08/2015, again ceased before they could be recorded.  ? Sleep apnea   ? wears cpap , no 02   ? SVT (supraventricular tachycardia) ([place]) 12/14/2017  ? ? ?Patient's surgical history, family medical history, social history, medications and allergies were all reviewed in Epic  ? ? ?Current Outpatient Medications  ?Medication Sig Dispense Refill  ? metoprolol succinate (TOPROL-XL) 25 MG 24 hr tablet Take 1 tablet (25 mg total) by mouth daily. (Patient taking differently: Take 25 mg by mouth as needed.) 90 tablet 3  ? Multiple Vitamin (MULTIVITAMIN WITH MINERALS) TABS tablet Take 1 tablet by mouth daily.    ? tadalafil (CIALIS) 10 MG tablet Take 0.5 tablets (5 mg total) by mouth daily. (Patient taking differently: Take 5 mg by mouth as needed.) 90 tablet 3  ? tamsulosin (FLOMAX) 0.4 MG CAPS capsule Take 1 capsule (0.4 mg total) by mouth daily. 90 capsule 3  ? ?No current facility-administered medications for this visit.  ? ? ?Physical Exam:   ? ? ?BP 110/80 (BP Location: Left Arm, Patient Position: Sitting, Cuff Size: Normal)   Pulse (!) 56   Ht 6' (1.829 m)   Wt 184 lb (83.5 kg)   SpO2 97%   BMI 24.95 kg/m?  ? ?GENERAL:  Pleasant male in NAD ?PSYCH: : Cooperative, normal affect ?EENT:  conjunctiva pink, mucous membranes moist, neck supple without masses ?NEURO: Alert and oriented x 3, no focal neurologic deficits ?Rectal exam: Sensation intact and preserved anal wink.  Grade 2 hemorrhoids noted in RA position and  grade 1 in the LL position on anoscopy.  No external anal fissures noted. Normal sphincter tone. No palpable mass. No blood on the exam glove. (Chaperone: Lanny Hurst, CMA). ? ? ?IMPRESSION and PLAN:   ? ?#1.  Symptomatic internal hemorrhoids: ?PROCEDURE NOTE: ?The patient presents with symptomatic grade 2 hemorrhoids, unresponsive to maximal medical therapy, requesting rubber band ligation of symptomatic hemorrhoidal  disease.  All risks, benefits and alternative forms of therapy were described and informed consent was obtained. ? ?In the Left Lateral Decubitus position, anoscopic examination revealed grade 2 hemorrhoid in the RA position and grade 1 hemorrhoid in the LL position(s).  ?The anorectum was pre-medicated with RectiCare. ?The decision was made to band the RA and LL internal hemorrhoid columns, and the [place] O?Regan System was used to perform band ligation without complication.  ?Digital anorectal examination was then performed to assure proper positioning of the band, and to adjust the banded tissue as required. ? The patient was discharged home without pain or other issues.  Dietary and behavioral recommendations were given and along with follow-up instructions.   ?  ?The following adjunctive treatments were recommended: ? ?-Resume high-fiber diet with fiber supplement (i.e. Citrucel or Benefiber) with goal for soft stools without straining to have a BM. ?-Resume adequate fluid intake. ? ?The patient will return as needed if return of hemorrhoid symptoms for evaluation and possible additional banding as required. ?No complications were encountered and the patient tolerated the procedure well. ? ?   ? ?#2.  History of colon polyps ?- Repeat colonoscopy in 2026 for ongoing polyp surveillance ?    ? ?Lavena Bullion ,DO, FACG 01/07/2022, 3:49 PM ? ?

## 2022-01-07 NOTE — Patient Instructions (Addendum)
If you are age 62 or older, your body mass index should be between 23-30. Your Body mass index is 24.95 kg/m?Marland Kitchen If this is out of the aforementioned range listed, please consider follow up with your Primary Care Provider. ? ?If you are age 98 or younger, your body mass index should be between 19-25. Your Body mass index is 24.95 kg/m?Marland Kitchen If this is out of the aformentioned range listed, please consider follow up with your Primary Care Provider.  ? ?________________________________________________________ ? ?The [facility] providers would like to encourage you to use [facility] to communicate with providers for non-urgent requests or questions.  Due to long hold times on the telephone, sending your provider a message by [facility] may be a faster and more efficient way to get a response.  Please allow 48 business hours for a response.  Please remember that this is for non-urgent requests.  ?_______________________________________________________ ? ?HEMORRHOID BANDING PROCEDURE  ? ? FOLLOW-UP CARE ? ? ?The procedure you have had should have been relatively painless since the banding of the area involved does not have nerve endings and there is no pain sensation.  The rubber band cuts off the blood supply to the hemorrhoid and the band may fall off as soon as 48 hours after the banding (the band may occasionally be seen in the toilet bowl following a bowel movement). You may notice a temporary feeling of fullness in the rectum which should respond adequately to plain Tylenol? or Motrin?. ? ?Following the banding, avoid strenuous exercise that evening and resume full activity the next day.  A sitz bath (soaking in a warm tub) or bidet is soothing, and can be useful for cleansing the area after bowel movements.   ? ? ?To avoid constipation, take two tablespoons of natural wheat bran, natural oat bran, flax, Benefiber? or any over the counter fiber supplement and increase your water intake to 7-8 glasses daily.   ? ?Unless you  have been prescribed anorectal medication, do not put anything inside your rectum for two weeks: No suppositories, enemas, fingers, etc. ? ?Occasionally, you may have more bleeding than usual after the banding procedure.  This is often from the untreated hemorrhoids rather than the treated one.  Don?t be concerned if there is a tablespoon or so of blood.  If there is more blood than this, lie flat with your bottom higher than your head and apply an ice pack to the area. If the bleeding does not stop within a half an hour or if you feel faint, call our office at [phone] or go to the emergency room. ? ?Problems are not common; however, if there is a substantial amount of bleeding, severe pain, chills, fever or difficulty passing urine (very rare) or other problems, you should call us at [phone] or report to the nearest emergency room. ? ?Do not stay seated continuously for more than 2-3 hours for a day or two after the procedure.  Tighten your buttock muscles 10-15 times every two hours and take 10-15 deep breaths every 1-2 hours.  Do not spend more than a few minutes on the toilet if you cannot empty your bowel; instead re-visit the toilet at a later time. ? ?Follow up as needed ? ?It was a pleasure to see you today! ? ?Gerrit Heck, D.O. ? ? ? ? ? ?We want to thank you for trusting [facility] with your care. All of our staff and providers value the relationships we have built  with our patients, and it is an honor to care for you.  ? ?We are writing to let you know that [facility] will close on Feb 24, 2022, and we invite you to continue to see Dr. Carmell Austria and Gerrit Heck at the [facility] [place] office location. We are consolidating our serices at these [facility] practices to better provide care. Our office staff will work with you to ensure a seamless transition.  ? ?Gerrit Heck, DO -Dr. Bryan Lemma will be movig to [facility]  [facility] at [address], [place], [place] [address], effective Feb 24, 2022.  Contact [phone] to schedule an appointment with him.  ? ?Carmell Austria, MD- Dr. Lyndel Safe will be movig to [facility] at [address] [place], [place] [address], effective Feb 24, 2022.  Contact [phone] to schedule an appointment with him.  ? ?Requesting Medical Records ?If you need to request your medical records, please follow the instructions below. Your medical records are confidential, and a copy can be transferred to another provider or released to you or another person you designate only with your permission. ? ?There are several ways to request your medical records: ?Requests for medical records can be submitted through our practice.   ?You can also request your records electronically, in your MyChart account by selecting the ?Request Health Records? tab.  ?If you need additional information on how to request records, please go to [email or web address]/, choose Patient Information, then select Request Medical Records. ?To make an appointment or if you have any questions about your health care needs, please contact our office at [phone] and one of our staff members will be glad to assist you. ?[place] is committed to providing exceptional care for you and our community. Thank you for allowing [place] to serve your health care needs. ?Sincerely, ? ?Windy Canny, Director [facility] ?[place] also offers convenient virtual care options. Sore throat? Sinus problems? Cold or flu symptoms? Get care from the comfort of home with [facility] Video Visits and e-Visits. Learn more about the non-emergency conditions treated and start your virtual visit at [email or web address] ? ?

## 2022-01-08 ENCOUNTER — Telehealth: Payer: Self-pay | Admitting: Gastroenterology

## 2022-01-08 ENCOUNTER — Encounter: Payer: Self-pay | Admitting: Gastroenterology

## 2022-01-08 MED ORDER — ACETAMINOPHEN-CODEINE #3 300-30 MG PO TABS: 1.0000 | ORAL_TABLET | ORAL | 0 refills | Status: DC | PRN

## 2022-01-08 NOTE — Telephone Encounter (Signed)
After hours call. Pt underwent hemorrhoid banding yesterday afternoon with VC. He relates that moderate rectal pain developed around 6 pm and has persisted. He's tried alternating doses of acetaminophen and ibuprofen along with sitz baths with little improvement in pain. I tried to electronically prescribe Tylenol #3 1-2 PO q4-6 hours prn pain however I am unable to access [employer] to complete. Forwarding to Dr. Bryan Lemma for his review and further mgmt.  ?

## 2022-01-08 NOTE — Telephone Encounter (Signed)
Spoke with pt and let him know Tylenol #3 was sent to his pharmacy. Pt states he is still in pain, let pt know that Tylenol #3 should help and told pt if pain does not improve then he should call us back. Pt verbalized understanding and had no other concerns at end of call.  ?

## 2022-01-08 NOTE — Telephone Encounter (Signed)
FYI I was able to rx the Tyl #3 - laura please let patient know ?

## 2022-02-11 ENCOUNTER — Other Ambulatory Visit: Payer: Self-pay | Admitting: Osteopathic Medicine

## 2022-02-11 DIAGNOSIS — L820 Inflamed seborrheic keratosis: Secondary | ICD-10-CM | POA: Diagnosis not present

## 2022-02-11 DIAGNOSIS — L821 Other seborrheic keratosis: Secondary | ICD-10-CM | POA: Diagnosis not present

## 2022-02-11 DIAGNOSIS — L570 Actinic keratosis: Secondary | ICD-10-CM | POA: Diagnosis not present

## 2022-02-11 DIAGNOSIS — L2089 Other atopic dermatitis: Secondary | ICD-10-CM | POA: Diagnosis not present

## 2022-02-12 DIAGNOSIS — G4733 Obstructive sleep apnea (adult) (pediatric): Secondary | ICD-10-CM | POA: Diagnosis not present

## 2022-02-14 DIAGNOSIS — J329 Chronic sinusitis, unspecified: Secondary | ICD-10-CM | POA: Diagnosis not present

## 2022-02-14 DIAGNOSIS — Z9889 Other specified postprocedural states: Secondary | ICD-10-CM | POA: Diagnosis not present

## 2022-02-28 ENCOUNTER — Telehealth: Payer: Self-pay | Admitting: Cardiovascular Disease

## 2022-02-28 NOTE — Telephone Encounter (Signed)
OK to take propranolol as needed Discussed with Beckie Salts.  With recurrent spells would refer back to him to see   Next available

## 2022-02-28 NOTE — Telephone Encounter (Signed)
Spoke with the patient who states that earlier today his heart rate got up to 180bpm. He took two propanol tablets and it brought his heart rate down. About an hour later heart rate went back up so he took another propanolol. He was wondering how often he is able to take his propanolol. It looks like this fell off of his medication list. Previous prescription was for 20 mg to take every 6 hours as needed. Patient states that he has these episodes about twice per week. He states that they have increased in frequency recently. He does feel his heart racing and gets lightheaded when this occurs. He does take metoprolol 25 every morning. Patient is aware of ER precautions for sustained elevated HR. Will send to Dr. Acie Fredrickson for any further recommendations.  Propanolol has been added back to his list.

## 2022-02-28 NOTE — Telephone Encounter (Signed)
Pt c/o medication issue:  1. Name of Medication: Propranolol  2. How are you currently taking this medication (dosage and times per day)? NA  3. Are you having a reaction (difficulty breathing--STAT)? No  4. What is your medication issue?  Pt states that he his having high HR's and medication does not seem to be working

## 2022-03-03 NOTE — Telephone Encounter (Signed)
Attempted to contact patient. No answer, left detailed message (OK per DPR).  Per Dr. Harrington Challenger: OK to take propranolol as needed Discussed with Beckie Salts.  With recurrent spells would refer back to him to see   Next available  Will forward to scheduler and Dr. Tanna Furry nurse to schedule appt for next available.

## 2022-03-07 DIAGNOSIS — R519 Headache, unspecified: Secondary | ICD-10-CM | POA: Diagnosis not present

## 2022-03-07 DIAGNOSIS — J341 Cyst and mucocele of nose and nasal sinus: Secondary | ICD-10-CM | POA: Diagnosis not present

## 2022-03-07 DIAGNOSIS — G8929 Other chronic pain: Secondary | ICD-10-CM | POA: Diagnosis not present

## 2022-03-14 DIAGNOSIS — R519 Headache, unspecified: Secondary | ICD-10-CM | POA: Diagnosis not present

## 2022-03-14 DIAGNOSIS — Z9009 Acquired absence of other part of head and neck: Secondary | ICD-10-CM | POA: Diagnosis not present

## 2022-03-14 DIAGNOSIS — J341 Cyst and mucocele of nose and nasal sinus: Secondary | ICD-10-CM | POA: Diagnosis not present

## 2022-03-15 DIAGNOSIS — G4733 Obstructive sleep apnea (adult) (pediatric): Secondary | ICD-10-CM | POA: Diagnosis not present

## 2022-04-09 DIAGNOSIS — R519 Headache, unspecified: Secondary | ICD-10-CM | POA: Diagnosis not present

## 2022-04-09 DIAGNOSIS — G43909 Migraine, unspecified, not intractable, without status migrainosus: Secondary | ICD-10-CM | POA: Diagnosis not present

## 2022-04-09 DIAGNOSIS — J341 Cyst and mucocele of nose and nasal sinus: Secondary | ICD-10-CM | POA: Diagnosis not present

## 2022-04-11 ENCOUNTER — Ambulatory Visit (INDEPENDENT_AMBULATORY_CARE_PROVIDER_SITE_OTHER): Payer: BC Managed Care – PPO | Admitting: Internal Medicine

## 2022-04-11 ENCOUNTER — Encounter: Payer: Self-pay | Admitting: Internal Medicine

## 2022-04-11 ENCOUNTER — Telehealth: Payer: Self-pay | Admitting: Internal Medicine

## 2022-04-11 VITALS — BP 92/60 | HR 58 | Ht 72.0 in | Wt 183.0 lb

## 2022-04-11 DIAGNOSIS — I471 Supraventricular tachycardia: Secondary | ICD-10-CM

## 2022-04-11 NOTE — Progress Notes (Signed)
HPI [name] returns today for followup of SVT. He is a pleasant 61 yo man with symptomatic WPW syndrome who was found at EP study to have a concealed left posterior AP and inducible SVT. He underwent successful ablation via the retrograde approach. He has had minimal palpitations but was noted on routing ECG to have intermittent pre-excitation. He also notes non-exertional chest pain, described as a burning sensation which will wake him from sleep. He has recently developed recurrent palpitations and sustained SVT at almost 200/min. No syncope but he has had several trips to the ED where he would go home if the symptoms resolved.  No Known Allergies   Current Outpatient Medications  Medication Sig Dispense Refill   metoprolol succinate (TOPROL-XL) 25 MG 24 hr tablet Take 1 tablet (25 mg total) by mouth daily. 90 tablet 3   propranolol (INDERAL) 20 MG tablet Take 20 mg by mouth as needed.     tadalafil (CIALIS) 10 MG tablet Take 0.5 tablets (5 mg total) by mouth daily. (Patient taking differently: Take 5 mg by mouth as needed.) 90 tablet 3   tamsulosin (FLOMAX) 0.4 MG CAPS capsule Take 1 capsule (0.4 mg total) by mouth daily. 90 capsule 3   No current facility-administered medications for this visit.     Past Medical History:  Diagnosis Date   Allergy    Bilateral inguinal hernia without obstruction or gangrene 04/25/2014   Cataract    removed with lens implants , bilateral    Complication of anesthesia    trouble urinating after eye surgery 06   Family history of prostate cancer in father 04/13/2019   GERD (gastroesophageal reflux disease)    OCC- no meds    History of cardiac radiofrequency ablation 04/13/2019   Inguinal hernia    bilateral   OSA (obstructive sleep apnea) 09/12/2015   Rapid palpitations    a. Tachypalps, frog positive 2014 - suspected AVNRT but not documented. b. Recurrent palps 08/2015, again ceased before they could be recorded.   Sleep apnea    wears  cpap , no 02    SVT (supraventricular tachycardia) ([place]) 12/14/2017    ROS:   All systems reviewed and negative except as noted in the HPI.   Past Surgical History:  Procedure Laterality Date   COLONOSCOPY     ~10 yrs ago- [facility]    INGUINAL HERNIA REPAIR Bilateral 06/08/2014   Procedure: LAPAROSCOPIC BILATERAL INGUINAL HERNIA REPAIR ;  Surgeon: Imogene Burn. Georgette Dover, MD;  Location: [place];  Service: General;  Laterality: Bilateral;   INSERTION OF MESH Bilateral 06/08/2014   Procedure: INSERTION OF MESH;  Surgeon: Imogene Burn. Georgette Dover, MD;  Location: [place];  Service: General;  Laterality: Bilateral;   POLYPECTOMY     RETINAL DETACHMENT REPAIR W/ SCLERAL BUCKLE LE Right 2006   SVT ABLATION N/A 12/14/2017   Procedure: SVT ABLATION;  Surgeon: Evans Lance, MD;  Location: [facility];  Service: Cardiovascular;  Laterality: N/A;   VASECTOMY  2011     Family History  Problem Relation Age of Onset   Skin cancer Mother    High blood pressure Father    CAD Neg Hx        neg hx   Colon cancer Neg Hx    Colon polyps Neg Hx    Esophageal cancer Neg Hx    Rectal cancer Neg Hx    Stomach cancer Neg Hx      Social History  Socioeconomic History   Marital status: Married    Spouse name: Not on file   Number of children: Not on file   Years of education: Not on file   Highest education level: Not on file  Occupational History   Occupation: Ecologist: [employer]  Tobacco Use   Smoking status: Never   Smokeless tobacco: Never  Vaping Use   Vaping Use: Never used  Substance and Sexual Activity   Alcohol use: No   Drug use: No   Sexual activity: Yes    Partners: Female    Birth control/protection: Other-see comments    Comment: Vasectomy  Other Topics Concern   Not on file  Social History Narrative   Not on file   Social Determinants of Health   Financial Resource Strain: Low Risk  (12/15/2017)   Overall Financial Resource Strain (CARDIA)     Difficulty of Paying Living Expenses: Not hard at all  Food Insecurity: Unknown (12/15/2017)   Hunger Vital Sign    Worried About Running Out of Food in the Last Year: Patient refused    [place] in the Last Year: Patient refused  Transportation Needs: Unknown (12/15/2017)   PRAPARE - Hydrologist (Medical): Patient refused    Lack of Transportation (Non-Medical): Patient refused  Physical Activity: Not on file  Stress: Not on file  Social Connections: Not on file  Intimate Partner Violence: Not on file     BP 92/60   Pulse (!) 58   Ht 6' (1.829 m)   Wt 183 lb (83 kg)   SpO2 95%   BMI 24.82 kg/m   Physical Exam:  Well appearing NAD HEENT: Unremarkable Neck:  No JVD, no thyromegally Lymphatics:  No adenopathy Back:  No CVA tenderness Lungs:  Clear with no wheezes HEART:  Regular rate rhythm, no murmurs, no rubs, no clicks Abd:  soft, positive bowel sounds, no organomegally, no rebound, no guarding Ext:  2 plus pulses, no edema, no cyanosis, no clubbing Skin:  No rashes no nodules Neuro:  CN II through XII intact, motor grossly intact  EKG - nsr with no pre-excitation   Assess/Plan:  1. SVT - he has had recurrent sustained symptoms. He has a h/o intermittent pre-excitation. He has failed medical therapy with a beta blocker. I discussed repeat ablation. I would plan a transeptal approach vs flecainide and he would like to proceed with ablation. 2. Chest pain -no complaints today. 3. WPW - he has had at times intermittent pre-excitation. He is not pre-excited today. See above.   Mikle Bosworth.D.

## 2022-04-11 NOTE — Telephone Encounter (Signed)
Pt called to r/s ablation  Pt rescheduled for SVT ablation on June 12, 2022  Rework complete

## 2022-04-11 NOTE — Telephone Encounter (Signed)
Pt would like for nurse to return call regarding scheduled appt for procedure. Please advise

## 2022-04-11 NOTE — Patient Instructions (Addendum)
Medication Instructions:  Your physician recommends that you continue on your current medications as directed. Please refer to the Current Medication list given to you today.  Labwork:  You will have blood work drawn on 05/14/22 at [facility] between 800am and 430pm.     Testing/Procedures: None ordered.  Follow-Up: SEE INSTRUCTION LETTER.    Any Other Special Instructions Will Be Listed Below (If Applicable).  If you need a refill on your cardiac medications before your next appointment, please call your pharmacy.   Important Information About Sugar       Cardiac Ablation Cardiac ablation is a procedure to destroy, or ablate, a small amount of heart tissue in very specific places. The heart has many electrical connections. Sometimes these connections are abnormal and can cause the heart to beat very fast or irregularly. Ablating some of the areas that cause problems can improve the heart's rhythm or return it to normal. Ablation may be done for people who: Have Wolff-Parkinson-White syndrome. Have fast heart rhythms (tachycardia). Have taken medicines for an abnormal heart rhythm (arrhythmia) that were not effective or caused side effects. Have a high-risk heartbeat that may be life-threatening. During the procedure, a small incision is made in the neck or the groin, and a long, thin tube (catheter) is inserted into the incision and moved to the heart. Small devices (electrodes) on the tip of the catheter will send out electrical currents. A type of X-ray (fluoroscopy) will be used to help guide the catheter and to provide images of the heart. Tell a health care provider about: Any allergies you have. All medicines you are taking, including vitamins, herbs, eye drops, creams, and over-the-counter medicines. Any problems you or family members have had with anesthetic medicines. Any blood disorders you have. Any surgeries you have had. Any medical conditions you have, such as  kidney failure. Whether you are pregnant or may be pregnant. What are the risks? Generally, this is a safe procedure. However, problems may occur, including: Infection. Bruising and bleeding at the catheter insertion site. Bleeding into the chest, especially into the sac that surrounds the heart. This is a serious complication. Stroke or blood clots. Damage to nearby structures or organs. Allergic reaction to medicines or dyes. Need for a permanent pacemaker if the normal electrical system is damaged. A pacemaker is a small computer that sends electrical signals to the heart and helps your heart beat normally. The procedure not being fully effective. This may not be recognized until months later. Repeat ablation procedures are sometimes done. What happens before the procedure? Medicines Ask your health care provider about: Changing or stopping your regular medicines. This is especially important if you are taking diabetes medicines or blood thinners. Taking medicines such as aspirin and ibuprofen. These medicines can thin your blood. Do not take these medicines unless your health care provider tells you to take them. Taking over-the-counter medicines, vitamins, herbs, and supplements. General instructions Follow instructions from your health care provider about eating or drinking restrictions. Plan to have someone take you home from the hospital or clinic. If you will be going home right after the procedure, plan to have someone with you for 24 hours. Ask your health care provider what steps will be taken to prevent infection. What happens during the procedure?  An IV will be inserted into one of your veins. You will be given a medicine to help you relax (sedative). The skin on your neck or groin will be numbed. An incision will be  made in your neck or your groin. A needle will be inserted through the incision and into a large vein in your neck or groin. A catheter will be inserted into  the needle and moved to your heart. Dye may be injected through the catheter to help your surgeon see the area of the heart that needs treatment. Electrical currents will be sent from the catheter to ablate heart tissue in desired areas. There are three types of energy that may be used to do this: Heat (radiofrequency energy). Laser energy. Extreme cold (cryoablation). When the tissue has been ablated, the catheter will be removed. Pressure will be held on the insertion area to prevent a lot of bleeding. A bandage (dressing) will be placed over the insertion area. The exact procedure may vary among health care providers and hospitals. What happens after the procedure? Your blood pressure, heart rate, breathing rate, and blood oxygen level will be monitored until you leave the hospital or clinic. Your insertion area will be monitored for bleeding. You will need to lie still for a few hours to ensure that you do not bleed from the insertion area. Do not drive for 24 hours or as long as told by your health care provider. Summary Cardiac ablation is a procedure to destroy, or ablate, a small amount of heart tissue using an electrical current. This procedure can improve the heart rhythm or return it to normal. Tell your health care provider about any medical conditions you may have and all medicines you are taking to treat them. This is a safe procedure, but problems may occur. Problems may include infection, bruising, damage to nearby organs or structures, or allergic reactions to medicines. Follow your health care provider's instructions about eating and drinking before the procedure. You may also be told to change or stop some of your medicines. After the procedure, do not drive for 24 hours or as long as told by your health care provider. This information is not intended to replace advice given to you by your health care provider. Make sure you discuss any questions you have with your health care  provider. Document Revised: 08/08/2019 Document Reviewed: 08/08/2019 Elsevier Patient Education  [place].

## 2022-04-14 DIAGNOSIS — G4733 Obstructive sleep apnea (adult) (pediatric): Secondary | ICD-10-CM | POA: Diagnosis not present

## 2022-05-14 ENCOUNTER — Other Ambulatory Visit: Payer: BC Managed Care – PPO

## 2022-05-27 ENCOUNTER — Other Ambulatory Visit: Payer: BC Managed Care – PPO

## 2022-05-27 DIAGNOSIS — L738 Other specified follicular disorders: Secondary | ICD-10-CM | POA: Diagnosis not present

## 2022-05-27 DIAGNOSIS — L249 Irritant contact dermatitis, unspecified cause: Secondary | ICD-10-CM | POA: Diagnosis not present

## 2022-05-27 DIAGNOSIS — I471 Supraventricular tachycardia: Secondary | ICD-10-CM

## 2022-05-28 LAB — BASIC METABOLIC PANEL
BUN/Creatinine Ratio: 20 (ref 10–24)
BUN: 20 mg/dL (ref 8–27)
CO2: 25 mmol/L (ref 20–29)
Calcium: 9.1 mg/dL (ref 8.6–10.2)
Chloride: 103 mmol/L (ref 96–106)
Creatinine, Ser: 1.01 mg/dL (ref 0.76–1.27)
Glucose: 82 mg/dL (ref 70–99)
Potassium: 4.3 mmol/L (ref 3.5–5.2)
Sodium: 139 mmol/L (ref 134–144)
eGFR: 84 mL/min/{1.73_m2} (ref >59)

## 2022-05-28 LAB — CBC WITH DIFFERENTIAL/PLATELET
Basophils Absolute: 0 10*3/uL (ref 0.0–0.2)
Basos: 1 %
EOS (ABSOLUTE): 0.2 10*3/uL (ref 0.0–0.4)
Eos: 4 %
Hematocrit: 41.6 % (ref 37.5–51.0)
Hemoglobin: 14.1 g/dL (ref 13.0–17.7)
Immature Grans (Abs): 0 10*3/uL (ref 0.0–0.1)
Immature Granulocytes: 1 %
Lymphocytes Absolute: 1.8 10*3/uL (ref 0.7–3.1)
Lymphs: 27 %
MCH: 31.8 pg (ref 26.6–33.0)
MCHC: 33.9 g/dL (ref 31.5–35.7)
MCV: 94 fL (ref 79–97)
Monocytes Absolute: 0.9 10*3/uL (ref 0.1–0.9)
Monocytes: 14 %
Neutrophils Absolute: 3.7 10*3/uL (ref 1.4–7.0)
Neutrophils: 53 %
Platelets: 199 10*3/uL (ref 150–450)
RBC: 4.43 x10E6/uL (ref 4.14–5.80)
RDW: 13 % (ref 11.6–15.4)
WBC: 6.7 10*3/uL (ref 3.4–10.8)

## 2022-06-11 DIAGNOSIS — G4733 Obstructive sleep apnea (adult) (pediatric): Secondary | ICD-10-CM | POA: Diagnosis not present

## 2022-06-11 NOTE — Pre-Procedure Instructions (Signed)
Instructed patient on the following items: °Arrival time 0530 °Nothing to eat or drink after midnight °No meds AM of procedure °Responsible person to drive you home and stay with you for 24 hrs ° ° °   °

## 2022-06-12 ENCOUNTER — Other Ambulatory Visit: Payer: Self-pay

## 2022-06-12 ENCOUNTER — Ambulatory Visit (HOSPITAL_COMMUNITY)
Admission: RE | Admit: 2022-06-12 | Discharge: 2022-06-12 | Disposition: A | Discharge status: Home / Self Care | Payer: BC Managed Care – PPO | Attending: Internal Medicine | Admitting: Internal Medicine

## 2022-06-12 ENCOUNTER — Encounter (HOSPITAL_COMMUNITY)
Admission: RE | Disposition: A | Discharge status: Home / Self Care | Payer: Self-pay | Source: Home / Self Care | Attending: Internal Medicine

## 2022-06-12 DIAGNOSIS — I456 Pre-excitation syndrome: Secondary | ICD-10-CM | POA: Insufficient documentation

## 2022-06-12 DIAGNOSIS — I471 Supraventricular tachycardia: Secondary | ICD-10-CM

## 2022-06-12 HISTORY — PX: SVT ABLATION: EP1225

## 2022-06-12 LAB — POCT ACTIVATED CLOTTING TIME
Activated Clotting Time: 215 seconds
Activated Clotting Time: 251 seconds
Activated Clotting Time: 311 seconds
Activated Clotting Time: 167 seconds

## 2022-06-12 SURGERY — SVT ABLATION

## 2022-06-12 MED ORDER — ACETAMINOPHEN 325 MG PO TABS: 650.0000 mg | ORAL_TABLET | ORAL | Status: DC | PRN

## 2022-06-12 MED ORDER — SODIUM CHLORIDE 0.9 % IV SOLN: 250.0000 mL | INTRAVENOUS | Status: DC | PRN

## 2022-06-12 MED ORDER — ISOPROTERENOL HCL 0.2 MG/ML IJ SOLN
INTRAMUSCULAR | Status: AC
Start: 2022-06-12 — End: ?
  Filled 2022-06-12: qty 5

## 2022-06-12 MED ORDER — HEPARIN (PORCINE) IN NACL 1000-0.9 UT/500ML-% IV SOLN
INTRAVENOUS | Status: AC
  Filled 2022-06-12: qty 500

## 2022-06-12 MED ORDER — ISOPROTERENOL HCL 0.2 MG/ML IJ SOLN
INTRAVENOUS | Status: DC | PRN
  Administered 2022-06-12: 4 ug/min via INTRAVENOUS

## 2022-06-12 MED ORDER — FENTANYL CITRATE (PF) 100 MCG/2ML IJ SOLN
INTRAMUSCULAR | Status: DC | PRN
  Administered 2022-06-12 (×2): 25 ug via INTRAVENOUS
  Administered 2022-06-12 (×2): 12.5 ug via INTRAVENOUS
  Administered 2022-06-12: 25 ug via INTRAVENOUS
  Administered 2022-06-12: 12.5 ug via INTRAVENOUS
  Administered 2022-06-12 (×2): 25 ug via INTRAVENOUS

## 2022-06-12 MED ORDER — MIDAZOLAM HCL 5 MG/5ML IJ SOLN
INTRAMUSCULAR | Status: AC
Start: 2022-06-12 — End: ?
  Filled 2022-06-12: qty 5

## 2022-06-12 MED ORDER — MIDAZOLAM HCL 5 MG/5ML IJ SOLN
INTRAMUSCULAR | Status: AC
  Filled 2022-06-12: qty 5

## 2022-06-12 MED ORDER — SODIUM CHLORIDE 0.9% FLUSH: 3.0000 mL | Freq: Two times a day (BID) | INTRAVENOUS | Status: DC

## 2022-06-12 MED ORDER — SODIUM CHLORIDE 0.9% FLUSH: 3.0000 mL | INTRAVENOUS | Status: DC | PRN

## 2022-06-12 MED ORDER — HEPARIN (PORCINE) IN NACL 1000-0.9 UT/500ML-% IV SOLN
INTRAVENOUS | Status: AC
Start: 2022-06-12 — End: ?
  Filled 2022-06-12: qty 500

## 2022-06-12 MED ORDER — METOPROLOL TARTRATE 5 MG/5ML IV SOLN
INTRAVENOUS | Status: DC | PRN
  Administered 2022-06-12: 5 mg via INTRAVENOUS

## 2022-06-12 MED ORDER — HEPARIN SODIUM (PORCINE) 1000 UNIT/ML IJ SOLN
INTRAMUSCULAR | Status: AC
  Filled 2022-06-12: qty 10

## 2022-06-12 MED ORDER — METOPROLOL TARTRATE 5 MG/5ML IV SOLN
INTRAVENOUS | Status: AC
Start: 2022-06-12 — End: ?
  Filled 2022-06-12: qty 5

## 2022-06-12 MED ORDER — FENTANYL CITRATE (PF) 100 MCG/2ML IJ SOLN
INTRAMUSCULAR | Status: AC
  Filled 2022-06-12: qty 2

## 2022-06-12 MED ORDER — SODIUM CHLORIDE 0.9 % IV SOLN: INTRAVENOUS | Status: DC

## 2022-06-12 MED ORDER — MIDAZOLAM HCL 5 MG/5ML IJ SOLN
INTRAMUSCULAR | Status: DC | PRN
  Administered 2022-06-12: 2 mg via INTRAVENOUS
  Administered 2022-06-12: 1 mg via INTRAVENOUS
  Administered 2022-06-12 (×2): 2 mg via INTRAVENOUS
  Administered 2022-06-12 (×2): 1 mg via INTRAVENOUS
  Administered 2022-06-12 (×2): 2 mg via INTRAVENOUS

## 2022-06-12 MED ORDER — ONDANSETRON HCL 4 MG/2ML IJ SOLN: 4.0000 mg | Freq: Four times a day (QID) | INTRAMUSCULAR | Status: DC | PRN

## 2022-06-12 MED ORDER — BUPIVACAINE HCL (PF) 0.25 % IJ SOLN
INTRAMUSCULAR | Status: AC
  Filled 2022-06-12: qty 60

## 2022-06-12 MED ORDER — PROTAMINE SULFATE 10 MG/ML IV SOLN
25.0000 mg | Freq: Once | INTRAVENOUS | Status: AC
  Administered 2022-06-12: 25 mg via INTRAVENOUS
  Filled 2022-06-12: qty 2.5

## 2022-06-12 MED ORDER — HEPARIN SODIUM (PORCINE) 1000 UNIT/ML IJ SOLN
INTRAMUSCULAR | Status: DC | PRN
  Administered 2022-06-12: 1000 [IU] via INTRAVENOUS
  Administered 2022-06-12: 5000 [IU] via INTRAVENOUS
  Administered 2022-06-12: 6000 [IU] via INTRAVENOUS
  Administered 2022-06-12: 3000 [IU] via INTRAVENOUS

## 2022-06-12 MED ORDER — BUPIVACAINE HCL (PF) 0.25 % IJ SOLN
INTRAMUSCULAR | Status: DC | PRN
  Administered 2022-06-12: 60 mL

## 2022-06-12 MED ORDER — PROTAMINE SULFATE 10 MG/ML IV SOLN
INTRAVENOUS | Status: AC
  Filled 2022-06-12: qty 5

## 2022-06-12 MED ORDER — HEPARIN (PORCINE) IN NACL 1000-0.9 UT/500ML-% IV SOLN
INTRAVENOUS | Status: DC | PRN
  Administered 2022-06-12 (×3): 500 mL

## 2022-06-12 SURGICAL SUPPLY — 15 items
CATH JOSEPH QUAD ALLRED 6F REP (CATHETERS) IMPLANT
CATH SMTCH THERMOCOOL SF DF (CATHETERS) IMPLANT
CATH SOUNDSTAR ECO 8FR (CATHETERS) IMPLANT
CATH WEBSTER BI DIR CS D-F CRV (CATHETERS) IMPLANT
PACK EP LATEX FREE (CUSTOM PROCEDURE TRAY) ×1
PACK EP LF (CUSTOM PROCEDURE TRAY) ×2 IMPLANT
PAD DEFIB RADIO PHYSIO CONN (PAD) ×2 IMPLANT
PATCH CARTO3 (PAD) IMPLANT
SHEATH BAYLIS TRANSSEPTAL 98CM (NEEDLE) IMPLANT
SHEATH CARTO VIZIGO SM CVD (SHEATH) IMPLANT
SHEATH PINNACLE 6F 10CM (SHEATH) IMPLANT
SHEATH PINNACLE 7F 10CM (SHEATH) IMPLANT
SHEATH PINNACLE 8F 10CM (SHEATH) IMPLANT
SHEATH PINNACLE 9F 10CM (SHEATH) IMPLANT
TUBING SMART ABLATE COOLFLOW (TUBING) IMPLANT

## 2022-06-12 NOTE — Progress Notes (Signed)
Site area: rt groin 10F arterial sheath, 82F, 54F, 80F venous sheaths Site Prior to Removal:  Level 0 Pressure Applied For: 30 minutes Manual:   yes Patient Status During Pull:  stable Post Pull Site:  Level 0 Post Pull Instructions Given:  yes Post Pull Pulses Present: rt dp palpable Dressing Applied:  gauze and tegaderm Bedrest begins @  Comments:

## 2022-06-12 NOTE — H&P (Signed)
HPI [name] returns today for followup of SVT. He is a pleasant 61 yo man with symptomatic WPW syndrome who was found at EP study to have a concealed left posterior AP and inducible SVT. He underwent successful ablation via the retrograde approach. He has had minimal palpitations but was noted on routing ECG to have intermittent pre-excitation. He also notes non-exertional chest pain, described as a burning sensation which will wake him from sleep. He has recently developed recurrent palpitations and sustained SVT at almost 200/min. No syncope but he has had several trips to the ED where he would go home if the symptoms resolved.  No Known Allergies           Current Outpatient Medications  Medication Sig Dispense Refill   metoprolol succinate (TOPROL-XL) 25 MG 24 hr tablet Take 1 tablet (25 mg total) by mouth daily. 90 tablet 3   propranolol (INDERAL) 20 MG tablet Take 20 mg by mouth as needed.       tadalafil (CIALIS) 10 MG tablet Take 0.5 tablets (5 mg total) by mouth daily. (Patient taking differently: Take 5 mg by mouth as needed.) 90 tablet 3   tamsulosin (FLOMAX) 0.4 MG CAPS capsule Take 1 capsule (0.4 mg total) by mouth daily. 90 capsule 3    No current facility-administered medications for this visit.            Past Medical History:  Diagnosis Date   Allergy     Bilateral inguinal hernia without obstruction or gangrene 04/25/2014   Cataract      removed with lens implants , bilateral    Complication of anesthesia      trouble urinating after eye surgery 06   Family history of prostate cancer in father 04/13/2019   GERD (gastroesophageal reflux disease)      OCC- no meds    History of cardiac radiofrequency ablation 04/13/2019   Inguinal hernia      bilateral   OSA (obstructive sleep apnea) 09/12/2015   Rapid palpitations      a. Tachypalps, frog positive 2014 - suspected AVNRT but not documented. b. Recurrent palps 08/2015, again ceased before they could be  recorded.   Sleep apnea      wears cpap , no 02    SVT (supraventricular tachycardia) ([place]) 12/14/2017      ROS:    All systems reviewed and negative except as noted in the HPI.          Past Surgical History:  Procedure Laterality Date   COLONOSCOPY        ~10 yrs ago- [facility]    INGUINAL HERNIA REPAIR Bilateral 06/08/2014    Procedure: LAPAROSCOPIC BILATERAL INGUINAL HERNIA REPAIR ;  Surgeon: Imogene Burn. Georgette Dover, MD;  Location: [place];  Service: General;  Laterality: Bilateral;   INSERTION OF MESH Bilateral 06/08/2014    Procedure: INSERTION OF MESH;  Surgeon: Imogene Burn. Georgette Dover, MD;  Location: [place];  Service: General;  Laterality: Bilateral;   POLYPECTOMY       RETINAL DETACHMENT REPAIR W/ SCLERAL BUCKLE LE Right 2006   SVT ABLATION N/A 12/14/2017    Procedure: SVT ABLATION;  Surgeon: Evans Lance, MD;  Location: [facility];  Service: Cardiovascular;  Laterality: N/A;   VASECTOMY   2011             Family History  Problem Relation Age of Onset   Skin cancer Mother  High blood pressure Father     CAD Neg Hx          neg hx   Colon cancer Neg Hx     Colon polyps Neg Hx     Esophageal cancer Neg Hx     Rectal cancer Neg Hx     Stomach cancer Neg Hx          Social History         Socioeconomic History   Marital status: Married      Spouse name: Not on file   Number of children: Not on file   Years of education: Not on file   Highest education level: Not on file  Occupational History   Occupation: Engineer, agricultural: [employer]  Tobacco Use   Smoking status: Never   Smokeless tobacco: Never  Vaping Use   Vaping Use: Never used  Substance and Sexual Activity   Alcohol use: No   Drug use: No   Sexual activity: Yes      Partners: Female      Birth control/protection: Other-see comments      Comment: Vasectomy  Other Topics Concern   Not on file  Social History Narrative   Not on file    Social Determinants of Health         Financial Resource Strain: Low Risk  (12/15/2017)    Overall Financial Resource Strain (CARDIA)     Difficulty of Paying Living Expenses: Not hard at all  Food Insecurity: Unknown (12/15/2017)    Hunger Vital Sign     Worried About Running Out of Food in the Last Year: Patient refused     Ran Out of Food in the Last Year: Patient refused  Transportation Needs: Unknown (12/15/2017)    PRAPARE - Armed forces logistics/support/administrative officer (Medical): Patient refused     Lack of Transportation (Non-Medical): Patient refused  Physical Activity: Not on file  Stress: Not on file  Social Connections: Not on file  Intimate Partner Violence: Not on file        BP 92/60   Pulse (!) 58   Ht 6' (1.829 m)   Wt 183 lb (83 kg)   SpO2 95%   BMI 24.82 kg/m    Physical Exam:   Well appearing NAD HEENT: Unremarkable Neck:  No JVD, no thyromegally Lymphatics:  No adenopathy Back:  No CVA tenderness Lungs:  Clear with no wheezes HEART:  Regular rate rhythm, no murmurs, no rubs, no clicks Abd:  soft, positive bowel sounds, no organomegally, no rebound, no guarding Ext:  2 plus pulses, no edema, no cyanosis, no clubbing Skin:  No rashes no nodules Neuro:  CN II through XII intact, motor grossly intact   EKG - nsr with no pre-excitation     Assess/Plan:  1. SVT - he has had recurrent sustained symptoms. He has a h/o intermittent pre-excitation. He has failed medical therapy with a beta blocker. I discussed repeat ablation. I would plan a transeptal approach vs flecainide and he would like to proceed with ablation. 2. Chest pain -no complaints today. 3. WPW - he has had at times intermittent pre-excitation. He is not pre-excited today. See above.   Blake Sims.D.

## 2022-06-12 NOTE — Discharge Instructions (Addendum)
Post procedure care instructions No driving for 4 days. No lifting over 5 lbs for 1 week. No vigorous or sexual activity for 1 week. You may return to work/your usual activities on 06/20/22. Keep procedure site clean & dry. If you notice increased pain, swelling, bleeding or pus, call/return!  You may shower after 24 hours, but no soaking in baths/hot tubs/pools for 1 week.   Cardiac Ablation, Care After  This sheet gives you information about how to care for yourself after your procedure. Your health care provider may also give you more specific instructions. If you have problems or questions, contact your health care provider. What can I expect after the procedure? After the procedure, it is common to have: Bruising around your puncture site. Tenderness around your puncture site. Skipped heartbeats. Tiredness (fatigue).  Follow these instructions at home: Puncture site care  Follow instructions from your health care provider about how to take care of your puncture site. Make sure you: If present, leave stitches (sutures), skin glue, or adhesive strips in place. These skin closures may need to stay in place for up to 2 weeks. If adhesive strip edges start to loosen and curl up, you may trim the loose edges. Do not remove adhesive strips completely unless your health care provider tells you to do that. If a large square bandage is present, this may be removed 24 hours after surgery.  Check your puncture site every day for signs of infection. Check for: Redness, swelling, or pain. Fluid or blood. If your puncture site starts to bleed, lie down on your back, apply firm pressure to the area, and contact your health care provider. Warmth. Pus or a bad smell. A pea or small marble sized lump at the site is normal and can take up to three months to resolve.  Driving Do not drive for at least 4 days after your procedure or however long your health care provider recommends. (Do not resume driving if  you have previously been instructed not to drive for other health reasons.) Do not drive or use heavy machinery while taking prescription pain medicine. Activity Avoid activities that take a lot of effort for at least 7 days after your procedure. Do not lift anything that is heavier than 5 lb (4.5 kg) for one week.  No sexual activity for 1 week.  Return to your normal activities as told by your health care provider. Ask your health care provider what activities are safe for you. General instructions Take over-the-counter and prescription medicines only as told by your health care provider. Do not use any products that contain nicotine or tobacco, such as cigarettes and e-cigarettes. If you need help quitting, ask your health care provider. You may shower after 24 hours, but Do not take baths, swim, or use a hot tub for 1 week.  Do not drink alcohol for 24 hours after your procedure. Keep all follow-up visits as told by your health care provider. This is important. Contact a health care provider if: You have redness, mild swelling, or pain around your puncture site. You have fluid or blood coming from your puncture site that stops after applying firm pressure to the area. Your puncture site feels warm to the touch. You have pus or a bad smell coming from your puncture site. You have a fever. You have chest pain or discomfort that spreads to your neck, jaw, or arm. You are sweating a lot. You feel nauseous. You have a fast or irregular heartbeat. You  have shortness of breath. You are dizzy or light-headed and feel the need to lie down. You have pain or numbness in the arm or leg closest to your puncture site. Get help right away if: Your puncture site suddenly swells. Your puncture site is bleeding and the bleeding does not stop after applying firm pressure to the area. These symptoms may represent a serious problem that is an emergency. Do not wait to see if the symptoms will go away.  Get medical help right away. Call your local emergency services (911 in the [place] Do not drive yourself to the hospital. Summary After the procedure, it is normal to have bruising and tenderness at the puncture site in your groin, neck, or forearm. Check your puncture site every day for signs of infection. Get help right away if your puncture site is bleeding and the bleeding does not stop after applying firm pressure to the area. This is a medical emergency. This information is not intended to replace advice given to you by your health care provider. Make sure you discuss any questions you have with your health care provider.

## 2022-06-12 NOTE — Progress Notes (Signed)
Site area: left groin 66F venous sheath Site Prior to removal: level 0 Pressure applied for: 30 mins Manual: yes Patient Status During Pull: stable Post Pull Site: Level 0 Post Pull Instructions Given: Yes Post Pull Pulses Present: Left dp palpable Dressing Applied: gauze and tegaderm Bedrest begins @ 1240

## 2022-06-13 ENCOUNTER — Encounter (HOSPITAL_COMMUNITY): Payer: Self-pay | Admitting: Internal Medicine

## 2022-07-12 DIAGNOSIS — G4733 Obstructive sleep apnea (adult) (pediatric): Secondary | ICD-10-CM | POA: Diagnosis not present

## 2022-07-17 ENCOUNTER — Ambulatory Visit: Payer: BC Managed Care – PPO | Attending: Internal Medicine | Admitting: Internal Medicine

## 2022-07-17 VITALS — BP 108/68 | HR 60 | Ht 72.0 in | Wt 180.0 lb

## 2022-07-17 DIAGNOSIS — R002 Palpitations: Secondary | ICD-10-CM | POA: Diagnosis not present

## 2022-07-17 DIAGNOSIS — I471 Supraventricular tachycardia, unspecified: Secondary | ICD-10-CM

## 2022-07-17 NOTE — Progress Notes (Signed)
HPI [name] returns today for followup. He is a pleasant 61 yo man with a h/o SVT who underwent EP study and catheter ablation. He had AVNRT. He has done well in the interim. No chest pain or sob. No palpitations or heart racing.  No Known Allergies   Current Outpatient Medications  Medication Sig Dispense Refill   tamsulosin (FLOMAX) 0.4 MG CAPS capsule Take 1 capsule (0.4 mg total) by mouth daily. 90 capsule 3   levETIRAcetam (KEPPRA) 500 MG tablet Take 500 mg by mouth 2 (two) times daily. (Patient not taking: Reported on 07/17/2022)     tadalafil (CIALIS) 10 MG tablet Take 0.5 tablets (5 mg total) by mouth daily. (Patient not taking: Reported on 07/17/2022) 90 tablet 3   No current facility-administered medications for this visit.     Past Medical History:  Diagnosis Date   Allergy    Bilateral inguinal hernia without obstruction or gangrene 04/25/2014   Cataract    removed with lens implants , bilateral    Complication of anesthesia    trouble urinating after eye surgery 06   Family history of prostate cancer in father 04/13/2019   GERD (gastroesophageal reflux disease)    OCC- no meds    History of cardiac radiofrequency ablation 04/13/2019   Inguinal hernia    bilateral   OSA (obstructive sleep apnea) 09/12/2015   Rapid palpitations    a. Tachypalps, frog positive 2014 - suspected AVNRT but not documented. b. Recurrent palps 08/2015, again ceased before they could be recorded.   Sleep apnea    wears cpap , no 02    SVT (supraventricular tachycardia) ([place]) 12/14/2017    ROS:   All systems reviewed and negative except as noted in the HPI.   Past Surgical History:  Procedure Laterality Date   COLONOSCOPY     ~10 yrs ago- [facility]    INGUINAL HERNIA REPAIR Bilateral 06/08/2014   Procedure: LAPAROSCOPIC BILATERAL INGUINAL HERNIA REPAIR ;  Surgeon: Imogene Burn. Georgette Dover, MD;  Location: [facility];  Service: General;  Laterality: Bilateral;   INSERTION OF MESH Bilateral  06/08/2014   Procedure: INSERTION OF MESH;  Surgeon: Imogene Burn. Georgette Dover, MD;  Location: [facility];  Service: General;  Laterality: Bilateral;   POLYPECTOMY     RETINAL DETACHMENT REPAIR W/ SCLERAL BUCKLE LE Right 2006   SVT ABLATION N/A 12/14/2017   Procedure: SVT ABLATION;  Surgeon: Evans Lance, MD;  Location: [facility];  Service: Cardiovascular;  Laterality: N/A;   SVT ABLATION N/A 06/12/2022   Procedure: SVT ABLATION;  Surgeon: Evans Lance, MD;  Location: [facility];  Service: Cardiovascular;  Laterality: N/A;   VASECTOMY  2011     Family History  Problem Relation Age of Onset   Skin cancer Mother    High blood pressure Father    CAD Neg Hx        neg hx   Colon cancer Neg Hx    Colon polyps Neg Hx    Esophageal cancer Neg Hx    Rectal cancer Neg Hx    Stomach cancer Neg Hx      Social History   Socioeconomic History   Marital status: Married    Spouse name: Not on file   Number of children: Not on file   Years of education: Not on file   Highest education level: Not on file  Occupational History   Occupation: Ecologist: [employer]  Tobacco Use   Smoking status: Never   Smokeless tobacco: Never  Vaping Use   Vaping Use: Never used  Substance and Sexual Activity   Alcohol use: No   Drug use: No   Sexual activity: Yes    Partners: Female    Birth control/protection: Other-see comments    Comment: Vasectomy  Other Topics Concern   Not on file  Social History Narrative   Not on file   Social Determinants of Health   Financial Resource Strain: Low Risk  (12/15/2017)   Overall Financial Resource Strain (CARDIA)    Difficulty of Paying Living Expenses: Not hard at all  Food Insecurity: Unknown (12/15/2017)   Hunger Vital Sign    Worried About Running Out of Food in the Last Year: Patient refused    Ran Out of Food in the Last Year: Patient refused  Transportation Needs: Unknown (12/15/2017)   PRAPARE - Armed forces logistics/support/administrative officer (Medical): Patient refused    Lack of Transportation (Non-Medical): Patient refused  Physical Activity: Not on file  Stress: Not on file  Social Connections: Not on file  Intimate Partner Violence: Not on file     BP 108/68   Pulse 60   Ht 6' (1.829 m)   Wt 180 lb (81.6 kg)   SpO2 97%   BMI 24.41 kg/m   Physical Exam:  Well appearing NAD HEENT: Unremarkable Neck:  No JVD, no thyromegally Lymphatics:  No adenopathy Back:  No CVA tenderness Lungs:  Clear HEART:  Regular rate rhythm, no murmurs, no rubs, no clicks Abd:  soft, positive bowel sounds, no organomegally, no rebound, no guarding Ext:  2 plus pulses, no edema, no cyanosis, no clubbing Skin:  No rashes no nodules Neuro:  CN II through XII intact, motor grossly intact  EKG - nsr  Assess/Plan: SVT - he is s/p catheter ablation and has not had any additional symptoms. No change in meds.  Carleene Overlie Danell Verno,MD

## 2022-07-17 NOTE — Patient Instructions (Addendum)
Medication Instructions:  Your physician recommends that you continue on your current medications as directed. Please refer to the Current Medication list given to you today.  *If you need a refill on your cardiac medications before your next appointment, please call your pharmacy*  Lab Work: None ordered.  If you have labs (blood work) drawn today and your tests are completely normal, you will receive your results only by: MyChart Message (if you have MyChart) OR A paper copy in the mail If you have any lab test that is abnormal or we need to change your treatment, we will call you to review the results.  Testing/Procedures: None ordered.  Follow-Up: At [facility], you and your health needs are our priority.  As part of our continuing mission to provide you with exceptional heart care, we have created designated Provider Care Teams.  These Care Teams include your primary Cardiologist (physician) and Advanced Practice Providers (APPs -  Physician Assistants and Nurse Practitioners) who all work together to provide you with the care you need, when you need it.  We recommend signing up for the patient portal called "MyChart".  Sign up information is provided on this After Visit Summary.  MyChart is used to connect with patients for Virtual Visits (Telemedicine).  Patients are able to view lab/test results, encounter notes, upcoming appointments, etc.  Non-urgent messages can be sent to your provider as well.   To learn more about what you can do with MyChart, go to [email or web address].    Your next appointment:   AS NEEDED  The format for your next appointment:   In Person  Provider:   Gregg Taylor, MD{or one of the following Advanced Practice Providers on your designated Care Team:   Renee Ursuy, PA-C Kaiel "Andy" Tillery, PA-C  Important Information About Sugar        

## 2022-07-17 NOTE — Progress Notes (Signed)
Ekg

## 2022-07-24 DIAGNOSIS — L298 Other pruritus: Secondary | ICD-10-CM | POA: Diagnosis not present

## 2022-08-01 DIAGNOSIS — N400 Enlarged prostate without lower urinary tract symptoms: Secondary | ICD-10-CM | POA: Diagnosis not present

## 2022-08-01 DIAGNOSIS — I471 Supraventricular tachycardia, unspecified: Secondary | ICD-10-CM | POA: Diagnosis not present

## 2022-08-01 DIAGNOSIS — N529 Male erectile dysfunction, unspecified: Secondary | ICD-10-CM | POA: Diagnosis not present

## 2022-08-01 DIAGNOSIS — J321 Chronic frontal sinusitis: Secondary | ICD-10-CM | POA: Diagnosis not present

## 2022-08-01 DIAGNOSIS — G4733 Obstructive sleep apnea (adult) (pediatric): Secondary | ICD-10-CM | POA: Diagnosis not present

## 2022-08-01 DIAGNOSIS — J341 Cyst and mucocele of nose and nasal sinus: Secondary | ICD-10-CM | POA: Diagnosis not present

## 2022-08-01 DIAGNOSIS — J328 Other chronic sinusitis: Secondary | ICD-10-CM | POA: Diagnosis not present

## 2022-08-01 DIAGNOSIS — J322 Chronic ethmoidal sinusitis: Secondary | ICD-10-CM | POA: Diagnosis not present

## 2022-08-01 DIAGNOSIS — R519 Headache, unspecified: Secondary | ICD-10-CM | POA: Diagnosis not present

## 2022-08-08 DIAGNOSIS — Z48810 Encounter for surgical aftercare following surgery on the sense organs: Secondary | ICD-10-CM | POA: Diagnosis not present

## 2022-08-08 DIAGNOSIS — J321 Chronic frontal sinusitis: Secondary | ICD-10-CM | POA: Diagnosis not present

## 2022-08-08 DIAGNOSIS — R519 Headache, unspecified: Secondary | ICD-10-CM | POA: Diagnosis not present

## 2022-08-08 DIAGNOSIS — J322 Chronic ethmoidal sinusitis: Secondary | ICD-10-CM | POA: Diagnosis not present

## 2022-08-08 DIAGNOSIS — J341 Cyst and mucocele of nose and nasal sinus: Secondary | ICD-10-CM | POA: Diagnosis not present

## 2022-08-11 DIAGNOSIS — G4733 Obstructive sleep apnea (adult) (pediatric): Secondary | ICD-10-CM | POA: Diagnosis not present

## 2022-08-20 DIAGNOSIS — R519 Headache, unspecified: Secondary | ICD-10-CM | POA: Diagnosis not present

## 2022-08-20 DIAGNOSIS — Z48810 Encounter for surgical aftercare following surgery on the sense organs: Secondary | ICD-10-CM | POA: Diagnosis not present

## 2022-08-20 DIAGNOSIS — J341 Cyst and mucocele of nose and nasal sinus: Secondary | ICD-10-CM | POA: Diagnosis not present

## 2022-09-09 DIAGNOSIS — G4733 Obstructive sleep apnea (adult) (pediatric): Secondary | ICD-10-CM | POA: Diagnosis not present

## 2022-09-10 DIAGNOSIS — J019 Acute sinusitis, unspecified: Secondary | ICD-10-CM | POA: Diagnosis not present

## 2022-09-11 DIAGNOSIS — G4733 Obstructive sleep apnea (adult) (pediatric): Secondary | ICD-10-CM | POA: Diagnosis not present

## 2022-10-03 DIAGNOSIS — J341 Cyst and mucocele of nose and nasal sinus: Secondary | ICD-10-CM | POA: Diagnosis not present

## 2022-11-03 ENCOUNTER — Ambulatory Visit: Payer: BC Managed Care – PPO | Admitting: Gastroenterology

## 2022-12-05 ENCOUNTER — Ambulatory Visit (INDEPENDENT_AMBULATORY_CARE_PROVIDER_SITE_OTHER): Payer: BC Managed Care – PPO | Admitting: Gastroenterology

## 2022-12-05 ENCOUNTER — Encounter: Payer: Self-pay | Admitting: Gastroenterology

## 2022-12-05 VITALS — BP 118/64 | HR 60 | Ht 72.0 in | Wt 182.0 lb

## 2022-12-05 DIAGNOSIS — K641 Second degree hemorrhoids: Secondary | ICD-10-CM

## 2022-12-05 MED ORDER — HYDROCORTISONE (PERIANAL) 2.5 % EX CREA: 1.0000 | TOPICAL_CREAM | Freq: Every day | CUTANEOUS | 1 refills | Status: DC

## 2022-12-05 NOTE — Patient Instructions (Addendum)
We have sent the following medications to your pharmacy for you to pick up at your convenience: Anusol Cream  Please purchase the following medications over the counter and take as directed: Preparation H Suppository  Apply pea Size amount of the Anusol Cream to the Suppository and insert nightly for one week  Follow Up as needed. _______________________________________________________  If your blood pressure at your visit was 140/90 or greater, please contact your primary care physician to follow up on this.  _______________________________________________________  If you are age 44 or younger, your body mass index should be between 19-25. Your Body mass index is 24.68 kg/m. If this is out of the aformentioned range listed, please consider follow up with your Primary Care Provider.   __________________________________________________________  The [facility] providers would like to encourage you to use [facility] to communicate with providers for non-urgent requests or questions.  Due to long hold times on the telephone, sending your provider a message by [facility] may be a faster and more efficient way to get a response.  Please allow 48 business hours for a response.  Please remember that this is for non-urgent requests.   Due to recent changes in healthcare laws, you may see the results of your imaging and laboratory studies on MyChart before your provider has had a chance to review them.  We understand that in some cases there may be results that are confusing or concerning to you. Not all laboratory results come back in the same time frame and the provider may be waiting for multiple results in order to interpret others.  Please give [place] 48 hours in order for your provider to thoroughly review all the results before contacting the office for clarification of your results.    Thank you for choosing me and [facility].  Vito Cirigliano, D.O.

## 2022-12-05 NOTE — Progress Notes (Signed)
Chief Complaint:    Symptomatic hemorrhoids  GI History: 62 year old male with medical history as outlined below, follows in the GI clinic for history of colon polyps and symptomatic internal hemorrhoids.   -11/06/2021: Banding of LL hemorrhoid -12/03/2021: Banding of RP hemorrhoid -12/30/2021: Banding of RA and LL hemorrhoid        - Did have post hemorrhoid banding pain after third banding, requiring Tylenol #3  Endoscopic History: - Colonoscopy (06/2020): 5 subcentimeter polyps, with pathology demonstrating Tubular Adenomas and Sessile Serrated Polyps. The prep was fair with retained solid food materials.  Repeat 6-12 months - Colonoscopy (09/2021):3 polyps in ascending colon (path: TA x2), 2 polyps in sigmoid colon (path: TA x1, inflammatory polyp x1), internal hemorrhoids.  Normal TI.  Repeat in 3 years  HPI:     Patient is a 62 y.o. male presenting to the Gastroenterology Clinic for evaluation of hemorrhoids.  Had done very well after previous hemorrhoid banding series with resolution of symptoms.   Unfortunately, he had a large kidney stone that he passed about 2 months ago, and with all the pushing/straining, had recurrence of hemorrhoids with prolapse and BRBPR.  Symptoms have slowly started to improve, and no BRB in the last couple weeks.   Review of systems:     No chest pain, no SOB, no fevers, no urinary sx   Past Medical History:  Diagnosis Date   Allergy    Bilateral inguinal hernia without obstruction or gangrene 04/25/2014   Cataract    removed with lens implants , bilateral    Complication of anesthesia    trouble urinating after eye surgery 06   Family history of prostate cancer in father 04/13/2019   GERD (gastroesophageal reflux disease)    OCC- no meds    History of cardiac radiofrequency ablation 04/13/2019   Inguinal hernia    bilateral   OSA (obstructive sleep apnea) 09/12/2015   Rapid palpitations    a. Tachypalps, frog positive 2014 - suspected AVNRT but  not documented. b. Recurrent palps 08/2015, again ceased before they could be recorded.   Sleep apnea    wears cpap , no 02    SVT (supraventricular tachycardia) 12/14/2017    Patient's surgical history, family medical history, social history, medications and allergies were all reviewed in Epic    Current Outpatient Medications  Medication Sig Dispense Refill   tadalafil (CIALIS) 10 MG tablet Take 0.5 tablets (5 mg total) by mouth daily. 90 tablet 3   tamsulosin (FLOMAX) 0.4 MG CAPS capsule Take 1 capsule (0.4 mg total) by mouth daily. 90 capsule 3   levETIRAcetam (KEPPRA) 500 MG tablet Take 500 mg by mouth 2 (two) times daily. (Patient not taking: Reported on 07/17/2022)     No current facility-administered medications for this visit.    Physical Exam:     BP 118/64   Pulse 60   Ht 6' (1.829 m)   Wt 182 lb (82.6 kg)   BMI 24.68 kg/m   GENERAL:  Pleasant male in NAD PSYCH: : Cooperative, normal affect NEURO: Alert and oriented x 3, no focal neurologic deficits Rectal exam: Sensation intact and preserved anal wink. No external anal fissures, external hemorrhoids or skin tags. Normal sphincter tone. No palpable mass. No blood on the exam glove.  Anoscopy with grade 1 hemorrhoids in the RA and LL positions.  (Chaperone: Renee Rival, CMA).    IMPRESSION and PLAN:    1) Internal Hemorrhoids 2) Hematochezia Recurrence of hemorrhoids due to pushing/straining  with recent large kidney stone.  Hemorrhoidal symptoms have decreased.  Exam today with grade 1 hemorrhoids in at least 2 columns.  Given his previous excellent response to hemorrhoid banding and recent clinical improvement, I think conservative management is appropriate   - Anusol supp qhs x1 week, then prn - Continue adequate hydration of stool softener/laxative if needed in the future to maintain soft stools - If ongoing or recurrent hemorrhoidal symptoms, he will call the office and plan for repeat hemorrhoid banding   3)  History of colon polyps - Repeat colonoscopy in 2026 for ongoing polyp surveillance           [place] ,DO, FACG 12/05/2022, 11:07 AM

## 2023-02-23 ENCOUNTER — Encounter: Payer: Self-pay | Admitting: Cardiovascular Disease

## 2023-04-14 ENCOUNTER — Ambulatory Visit (INDEPENDENT_AMBULATORY_CARE_PROVIDER_SITE_OTHER): Payer: BC Managed Care – PPO

## 2023-04-14 ENCOUNTER — Ambulatory Visit: Payer: BC Managed Care – PPO | Attending: Internal Medicine | Admitting: Internal Medicine

## 2023-04-14 ENCOUNTER — Encounter: Payer: Self-pay | Admitting: Internal Medicine

## 2023-04-14 VITALS — BP 128/82 | HR 58 | Ht 72.0 in | Wt 181.0 lb

## 2023-04-14 DIAGNOSIS — I471 Supraventricular tachycardia, unspecified: Secondary | ICD-10-CM

## 2023-04-14 NOTE — Progress Notes (Signed)
HPI [name] returns today for followup. The patient is a pleasant 62 yo man with a h/o SVT who was found to have WPW syndrome and successful ablation about a year ago. Over the past few weeks he has experienced palpitations which he describes as worse at night particularly in the left lat position while sleeping and described as a skipping feeling. He does not abuse caffeine. No syncope or sustained heart racing.  No Known Allergies   Current Outpatient Medications  Medication Sig Dispense Refill   hydrocortisone (ANUSOL-HC) 2.5 % rectal cream Place 1 Application rectally at bedtime. Apply pea Size amount of the Anusol Cream to the Suppository and insert nightly for one week 30 g 1   levETIRAcetam (KEPPRA) 500 MG tablet Take 500 mg by mouth 2 (two) times daily.     tadalafil (CIALIS) 10 MG tablet Take 0.5 tablets (5 mg total) by mouth daily. 90 tablet 3   tamsulosin (FLOMAX) 0.4 MG CAPS capsule Take 1 capsule (0.4 mg total) by mouth daily. 90 capsule 3   No current facility-administered medications for this visit.     Past Medical History:  Diagnosis Date   Allergy    Bilateral inguinal hernia without obstruction or gangrene 04/25/2014   Cataract    removed with lens implants , bilateral    Complication of anesthesia    trouble urinating after eye surgery 06   Family history of prostate cancer in father 04/13/2019   GERD (gastroesophageal reflux disease)    OCC- no meds    History of cardiac radiofrequency ablation 04/13/2019   Inguinal hernia    bilateral   OSA (obstructive sleep apnea) 09/12/2015   Rapid palpitations    a. Tachypalps, frog positive 2014 - suspected AVNRT but not documented. b. Recurrent palps 08/2015, again ceased before they could be recorded.   Sleep apnea    wears cpap , no 02    SVT (supraventricular tachycardia) 12/14/2017    ROS:   All systems reviewed and negative except as noted in the HPI.   Past Surgical History:  Procedure Laterality  Date   CARDIAC ELECTROPHYSIOLOGY MAPPING AND ABLATION     COLONOSCOPY     ~10 yrs ago- [facility]    INGUINAL HERNIA REPAIR Bilateral 06/08/2014   Procedure: LAPAROSCOPIC BILATERAL INGUINAL HERNIA REPAIR ;  Surgeon: Wilmon Arms. Corliss Skains, MD;  Location: [facility];  Service: General;  Laterality: Bilateral;   INSERTION OF MESH Bilateral 06/08/2014   Procedure: INSERTION OF MESH;  Surgeon: Wilmon Arms. Corliss Skains, MD;  Location: [facility];  Service: General;  Laterality: Bilateral;   POLYPECTOMY     RETINAL DETACHMENT REPAIR W/ SCLERAL BUCKLE LE Right 2006   SVT ABLATION N/A 12/14/2017   Procedure: SVT ABLATION;  Surgeon: Marinus Maw, MD;  Location: [facility];  Service: Cardiovascular;  Laterality: N/A;   SVT ABLATION N/A 06/12/2022   Procedure: SVT ABLATION;  Surgeon: Marinus Maw, MD;  Location: [facility];  Service: Cardiovascular;  Laterality: N/A;   VASECTOMY  2011     Family History  Problem Relation Age of Onset   Skin cancer Mother    High blood pressure Father    CAD Neg Hx        neg hx   Colon cancer Neg Hx    Colon polyps Neg Hx    Esophageal cancer Neg Hx    Rectal cancer Neg Hx    Stomach cancer Neg Hx  Social History   Socioeconomic History   Marital status: Married    Spouse name: Not on file   Number of children: Not on file   Years of education: Not on file   Highest education level: Not on file  Occupational History   Occupation: Engineer, structural: [employer]  Tobacco Use   Smoking status: Never   Smokeless tobacco: Never  Vaping Use   Vaping Use: Never used  Substance and Sexual Activity   Alcohol use: No   Drug use: No   Sexual activity: Yes    Partners: Female    Birth control/protection: Other-see comments    Comment: Vasectomy  Other Topics Concern   Not on file  Social History Narrative   Not on file   Social Determinants of Health   Financial Resource Strain: Low Risk  (12/15/2017)   Overall Financial Resource  Strain (CARDIA)    Difficulty of Paying Living Expenses: Not hard at all  Food Insecurity: Unknown (12/15/2017)   Hunger Vital Sign    Worried About Running Out of Food in the Last Year: Patient declined    Ran Out of Food in the Last Year: Patient declined  Transportation Needs: Unknown (12/15/2017)   PRAPARE - Administrator, Civil Service (Medical): Patient declined    Lack of Transportation (Non-Medical): Patient declined  Physical Activity: Not on file  Stress: Not on file  Social Connections: Not on file  Intimate Partner Violence: Not on file     BP 128/82   Pulse (!) 58   Ht 6' (1.829 m)   Wt 181 lb (82.1 kg)   SpO2 97%   BMI 24.55 kg/m   Physical Exam:  Well appearing NAD HEENT: Unremarkable Neck:  No JVD, no thyromegally Lymphatics:  No adenopathy Back:  No CVA tenderness Lungs:  Clear with no wheezes HEART:  Regular rate rhythm, no murmurs, no rubs, no clicks Abd:  soft, positive bowel sounds, no organomegally, no rebound, no guarding Ext:  2 plus pulses, no edema, no cyanosis, no clubbing Skin:  No rashes no nodules Neuro:  CN II through XII intact, motor grossly intact  EKG - nsr    Assess/Plan: Recurrent palpitations - I suspect that he is feeling PVC's but I have asked him to wear a 7 day Zio monitor. Additional recs will be based on the results. WPW - he is s/p ablation. He does not have any pre-excitation.  [name],MD

## 2023-04-14 NOTE — Progress Notes (Unsigned)
Enrolled for [employer] to mail a ZIO XT long term holter monitor to the patients address on file.  

## 2023-04-14 NOTE — Patient Instructions (Signed)
Medication Instructions:  Your physician recommends that you continue on your current medications as directed. Please refer to the Current Medication list given to you today.  *If you need a refill on your cardiac medications before your next appointment, please call your pharmacy*  Testing/Procedures: Your physician has recommended that you wear an event monitor. Event monitors are medical devices that record the heart's electrical activity. Doctors most often [place] these monitors to diagnose arrhythmias. Arrhythmias are problems with the speed or rhythm of the heartbeat. The monitor is a small, portable device. You can wear one while you do your normal daily activities. This is usually used to diagnose what is causing palpitations/syncope (passing out).  Follow-Up: At [facility], you and your health needs are our priority.  As part of our continuing mission to provide you with exceptional heart care, we have created designated Provider Care Teams.  These Care Teams include your primary Cardiologist (physician) and Advanced Practice Providers (APPs -  Physician Assistants and Nurse Practitioners) who all work together to provide you with the care you need, when you need it.    Your next appointment:   Follow up based on results of testing  Other Instructions ZIO XT- Long Term Monitor Instructions  Your physician has requested you wear a ZIO patch monitor for 7 days.  This is a single patch monitor. [employer] supplies one patch monitor per enrollment. Additional stickers are not available. Please do not apply patch if you will be having a Nuclear Stress Test,  Echocardiogram, Cardiac CT, MRI, or Chest Xray during the period you would be wearing the  monitor. The patch cannot be worn during these tests. You cannot remove and re-apply the  ZIO XT patch monitor.  Your ZIO patch monitor will be mailed 3 day USPS to your address on file. It may take 3-5 days  to receive your monitor after  you have been enrolled.  Once you have received your monitor, please review the enclosed instructions. Your monitor  has already been registered assigning a specific monitor serial # to you.  Billing and Patient Assistance Program Information  We have supplied [employer] with any of your insurance information on file for billing purposes. [employer] offers a sliding scale Patient Assistance Program for patients that do not have  insurance, or whose insurance does not completely cover the cost of the ZIO monitor.  You must apply for the Patient Assistance Program to qualify for this discounted rate.  To apply, please call [employer] at [phone], select option 4, select option 2, ask to apply for  Patient Assistance Program. Meredeth Ide will ask your household income, and how many people  are in your household. They will quote your out-of-pocket cost based on that information.  [employer] will also be able to set up a 32-month, interest-free payment plan if needed.  Applying the monitor   Shave hair from upper left chest.  Hold abrader disc by orange tab. Rub abrader in 40 strokes over the upper left chest as  indicated in your monitor instructions.  Clean area with 4 enclosed alcohol pads. Let dry.  Apply patch as indicated in monitor instructions. Patch will be placed under collarbone on left  side of chest with arrow pointing upward.  Rub patch adhesive wings for 2 minutes. Remove white label marked "1". Remove the white  label marked "2". Rub patch adhesive wings for 2 additional minutes.  While looking in a mirror, press and release button in center of patch. A small  green light will  flash 3-4 times. This will be your only indicator that the monitor has been turned on.  Do not shower for the first 24 hours. You may shower after the first 24 hours.  Press the button if you feel a symptom. You will hear a small click. Record Date, Time and  Symptom in the Patient Logbook.  When you are ready to  remove the patch, follow instructions on the last 2 pages of Patient  Logbook. Stick patch monitor onto the last page of Patient Logbook.  Place Patient Logbook in the blue and white box. Use locking tab on box and tape box closed  securely. The blue and white box has prepaid postage on it. Please place it in the mailbox as  soon as possible. Your physician should have your test results approximately 7 days after the  monitor has been mailed back to [facility].  Call [facility] Customer Care at [phone] if you have questions regarding  your ZIO XT patch monitor. Call them immediately if you see an orange light blinking on your  monitor.  If your monitor falls off in less than 4 days, contact our Monitor department at [phone].  If your monitor becomes loose or falls off after 4 days call [employer] at [phone] for  suggestions on securing your monitor

## 2023-04-25 DIAGNOSIS — I471 Supraventricular tachycardia, unspecified: Secondary | ICD-10-CM | POA: Diagnosis not present

## 2023-05-12 ENCOUNTER — Telehealth: Payer: Self-pay

## 2023-05-12 NOTE — Telephone Encounter (Signed)
Results released via MyChart.  No PPM recommended at this time.  Recall entered.

## 2023-05-12 NOTE — Telephone Encounter (Signed)
-----   Message from Lewayne Bunting sent at 05/11/2023  1:13 PM EDT ----- Let him know that his heart is a little slow but not terrible. No indication for PPM at this time. No change in meds.

## 2023-09-01 ENCOUNTER — Other Ambulatory Visit: Payer: Self-pay | Admitting: Medical Genetics

## 2023-09-01 DIAGNOSIS — Z006 Encounter for examination for normal comparison and control in clinical research program: Secondary | ICD-10-CM

## 2023-10-27 ENCOUNTER — Other Ambulatory Visit (HOSPITAL_COMMUNITY)
Admission: RE | Admit: 2023-10-27 | Discharge: 2023-10-27 | Disposition: A | Discharge status: Home / Self Care | Payer: Self-pay | Source: Ambulatory Visit | Attending: Medical Genetics | Admitting: Medical Genetics

## 2023-10-27 DIAGNOSIS — Z006 Encounter for examination for normal comparison and control in clinical research program: Secondary | ICD-10-CM

## 2023-11-06 LAB — GENECONNECT MOLECULAR SCREEN: Genetic Analysis Overall Interpretation: NEGATIVE
